# Patient Record
Sex: Female | Born: 1940 | Race: White | Hispanic: No | Marital: Married | Smoking: Never smoker
Health system: Southern US, Community
[De-identification: ages and names within clinical notes are randomized; demographics above are authoritative.]

## PROBLEM LIST (undated history)

## (undated) DIAGNOSIS — M1990 Unspecified osteoarthritis, unspecified site: Secondary | ICD-10-CM

## (undated) DIAGNOSIS — E119 Type 2 diabetes mellitus without complications: Secondary | ICD-10-CM

## (undated) DIAGNOSIS — I2699 Other pulmonary embolism without acute cor pulmonale: Secondary | ICD-10-CM

## (undated) DIAGNOSIS — I10 Essential (primary) hypertension: Secondary | ICD-10-CM

## (undated) DIAGNOSIS — M797 Fibromyalgia: Secondary | ICD-10-CM

## (undated) DIAGNOSIS — E079 Disorder of thyroid, unspecified: Secondary | ICD-10-CM

## (undated) DIAGNOSIS — R112 Nausea with vomiting, unspecified: Secondary | ICD-10-CM

## (undated) DIAGNOSIS — Z9889 Other specified postprocedural states: Secondary | ICD-10-CM

## (undated) DIAGNOSIS — Z972 Presence of dental prosthetic device (complete) (partial): Secondary | ICD-10-CM

## (undated) HISTORY — PX: KNEE SURGERY: SHX244

## (undated) HISTORY — PX: HERNIA REPAIR: SHX51

## (undated) HISTORY — PX: ABDOMINAL SURGERY: SHX537

## (undated) HISTORY — PX: COLON RESECTION: SHX5231

## (undated) HISTORY — PX: CHOLECYSTECTOMY: SHX55

## (undated) HISTORY — PX: ABDOMINAL HYSTERECTOMY: SHX81

## (undated) HISTORY — PX: APPENDECTOMY: SHX54

## (undated) HISTORY — PX: BUNIONECTOMY: SHX129

---

## 2004-11-27 ENCOUNTER — Other Ambulatory Visit: Admission: RE | Admit: 2004-11-27 | Discharge: 2004-11-27 | Payer: Self-pay | Admitting: Obstetrics & Gynecology

## 2005-02-08 ENCOUNTER — Encounter: Admission: RE | Admit: 2005-02-08 | Discharge: 2005-02-08 | Payer: Self-pay | Admitting: Otolaryngology

## 2007-09-09 ENCOUNTER — Encounter: Admission: RE | Admit: 2007-09-09 | Discharge: 2007-09-09 | Payer: Self-pay | Admitting: Family Medicine

## 2012-01-08 ENCOUNTER — Encounter (INDEPENDENT_AMBULATORY_CARE_PROVIDER_SITE_OTHER): Payer: Self-pay | Admitting: Surgery

## 2012-02-17 ENCOUNTER — Ambulatory Visit (INDEPENDENT_AMBULATORY_CARE_PROVIDER_SITE_OTHER): Payer: Medicare Other | Admitting: Surgery

## 2012-02-17 ENCOUNTER — Encounter (INDEPENDENT_AMBULATORY_CARE_PROVIDER_SITE_OTHER): Payer: Self-pay | Admitting: Surgery

## 2012-02-17 VITALS — BP 136/76 | HR 69 | Temp 98.4°F | Resp 18 | Ht 65.0 in | Wt 181.0 lb

## 2012-02-17 DIAGNOSIS — M6208 Separation of muscle (nontraumatic), other site: Secondary | ICD-10-CM | POA: Insufficient documentation

## 2012-02-17 DIAGNOSIS — M6200 Separation of muscle (nontraumatic), unspecified site: Secondary | ICD-10-CM

## 2012-02-17 NOTE — Progress Notes (Signed)
General Surgery [facility]  Chief Complaint  Patient presents with  . New Evaluation    evaluate possible ventral hernia    HISTORY: Patient is a 71 year old white female referred by her plastic surgeon for evaluation of possible ventral incisional hernia. Patient is considering abdominoplasty. She is currently on a weight loss program to lose 20 pounds by his instructions. She has had previous abdominal surgery including exploratory laparotomy, cholecystectomy, and colonic resection for diverticular disease. These procedures were performed in 1990 and in 2004.  Patient is now considering abdominoplasty. Her plastic surgeon request evaluation for possible ventral incisional hernia.  Patient has had no signs or symptoms of hernia. She does note a bulge in the upper abdomen. She occasionally has a burning type sensation at this position. She denies any signs or symptoms of obstruction. She has not had to manually reduce the hernia and any point.  No past medical history on file.   Current Outpatient Prescriptions  Medication Sig Dispense Refill  . amLODipine (NORVASC) 5 MG tablet Take 5 mg by mouth daily.      . cyanocobalamin 1000 MCG tablet Inject 1 mcg into the skin daily. 31m/l per month      . DULoxetine (CYMBALTA) 60 MG capsule Take 60 mg by mouth daily. 120qd      . gabapentin (NEURONTIN) 600 MG tablet Take 600 mg by mouth 3 (three) times daily.      . metFORMIN (GLUCOPHAGE) 1000 MG tablet Take 1,000 mg by mouth 2 (two) times daily with a meal.      . methocarbamol (ROBAXIN) 500 MG tablet Take 500 mg by mouth 4 (four) times daily.      . metoprolol succinate (TOPROL-XL) 50 MG 24 hr tablet Take 50 mg by mouth daily. Take with or immediately following a meal.      . omeprazole (PRILOSEC) 40 MG capsule Take 40 mg by mouth daily.      . pravastatin (PRAVACHOL) 80 MG tablet Take 80 mg by mouth daily.      Marland Kitchen thyroid (ARMOUR) 30 MG tablet Take 30 mg by mouth daily.          Allergies  Allergen Reactions  . Ativan (Lorazepam)   . Codeine   . Sulfur      No family history on file.   History   Social History  . Marital Status: Married    Spouse Name: N/A    Number of Children: N/A  . Years of Education: N/A   Social History Main Topics  . Smoking status: Never Smoker   . Smokeless tobacco: None  . Alcohol Use: None  . Drug Use: None  . Sexually Active: None   Other Topics Concern  . None   Social History Narrative  . None     REVIEW OF SYSTEMS - PERTINENT POSITIVES ONLY: Denies signs or symptoms of intestinal obstruction and denies manual reduction of hernia  EXAM: Filed Vitals:   02/17/12 1316  BP: 136/76  Pulse: 69  Temp: 98.4 F (36.9 C)  Resp: 18    HEENT: normocephalic; pupils equal and reactive; sclerae clear; dentition good; mucous membranes moist NECK:  symmetric on extension; no palpable anterior or posterior cervical lymphadenopathy; no supraclavicular masses; no tenderness CHEST: clear to auscultation bilaterally without rales, rhonchi, or wheezes CARDIAC: regular rate and rhythm without significant murmur; peripheral pulses are full ABDOMEN: soft without distension; bowel sounds present; no mass; no hepatosplenomegaly; well-healed midline abdominal incision; patient is examined  both recumbent and standing with cough and Valsalva and sit up maneuver; no evidence of ventral incisional hernia; mild to moderate diastases particularly in the upper abdomen; no palpable fascial defect identified EXT:  non-tender without edema; no deformity NEURO: no gross focal deficits; no sign of tremor   LABORATORY RESULTS: See [facility] (CHL-Epic) for most recent results   RADIOLOGY RESULTS: See [facility] (CHL-Epic) for most recent results   IMPRESSION: #1 history of laparotomy, 1990 and 2004, with 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 I do not find evidence of a ventral incisional hernia. There is some attenuation of the upper abdominal wall with bulging with increases in intra-abdominal pressure. I do not palpate atrial fascial defect.  Patient will proceed with her weight loss regimen. She will followup with her plastic surgeon to consider abdominoplasty.  I will discuss the case with her surgeon prior to her scheduled date of surgery in case our help is needed at the time of operative intervention. Otherwise I believe she can proceed with abdominoplasty as per the plans of her plastic surgeon without hernia repair at this time.  Patient will return for followup as needed.  Velora Heckler, MD, FACS General & Endocrine Surgery [facility]   Visit Diagnoses: 1. Rectus diastasis     Primary Care Physician: Candelaria Celeste JEHIEL, DO

## 2012-02-17 NOTE — Patient Instructions (Signed)
Hernia A hernia occurs when an internal organ pushes out through a weak spot in the abdominal wall. Hernias most commonly occur in the groin and around the navel. Hernias often can be pushed back into place (reduced). Most hernias tend to get worse over time. Some abdominal hernias can get stuck in the opening (irreducible or incarcerated hernia) and cannot be reduced. An irreducible abdominal hernia which is tightly squeezed into the opening is at risk for impaired blood supply (strangulated hernia). A strangulated hernia is a medical emergency. Because of the risk for an irreducible or strangulated hernia, surgery may be recommended to repair a hernia. CAUSES   Heavy lifting.  Prolonged coughing.  Straining to have a bowel movement.  A cut (incision) made during an abdominal surgery. HOME CARE INSTRUCTIONS   Bed rest is not required. You may continue your normal activities.  Avoid lifting more than 10 pounds (4.5 kg) or straining.  Cough gently. If you are a smoker it is best to stop. Even the best hernia repair can break down with the continual strain of coughing. Even if you do not have your hernia repaired, a cough will continue to aggravate the problem.  Do not wear anything tight over your hernia. Do not try to keep it in with an outside bandage or truss. These can damage abdominal contents if they are trapped within the hernia sac.  Eat a normal diet.  Avoid constipation. Straining over long periods of time will increase hernia size and encourage breakdown of repairs. If you cannot do this with diet alone, stool softeners may be used. SEEK IMMEDIATE MEDICAL CARE IF:   You have a fever.  You develop increasing abdominal pain.  You feel nauseous or vomit.  Your hernia is stuck outside the abdomen, looks discolored, feels hard, or is tender.  You have any changes in your bowel habits or in the hernia that are unusual for you.  You have increased pain or swelling around the  hernia.  You cannot push the hernia back in place by applying gentle pressure while lying down. MAKE SURE YOU:   Understand these instructions.  Will watch your condition.  Will get help right away if you are not doing well or get worse. Document Released: 04/22/2005 Document Revised: 07/15/2011 Document Reviewed: 12/10/2007 Exit[employer] Patient Information 2013 [employer].  

## 2016-03-25 ENCOUNTER — Ambulatory Visit (INDEPENDENT_AMBULATORY_CARE_PROVIDER_SITE_OTHER): Payer: Medicare Other

## 2016-03-25 ENCOUNTER — Ambulatory Visit (INDEPENDENT_AMBULATORY_CARE_PROVIDER_SITE_OTHER): Payer: Medicare Other | Admitting: Podiatry

## 2016-03-25 ENCOUNTER — Encounter: Payer: Self-pay | Admitting: Podiatry

## 2016-03-25 VITALS — Ht 65.0 in | Wt 178.0 lb

## 2016-03-25 DIAGNOSIS — R52 Pain, unspecified: Secondary | ICD-10-CM | POA: Diagnosis not present

## 2016-03-25 DIAGNOSIS — M2010 Hallux valgus (acquired), unspecified foot: Secondary | ICD-10-CM | POA: Diagnosis not present

## 2016-03-25 DIAGNOSIS — M2042 Other hammer toe(s) (acquired), left foot: Secondary | ICD-10-CM

## 2016-03-25 NOTE — Progress Notes (Signed)
   Subjective:    Patient ID: Natasha Chan, female    DOB: 1940/11/25, 75 y.o.   MRN: [id number]  HPI this patient presents the office with chief complaint of a painful big toe  on her left foot.  She admits to having a bunion, which has become extremely painful and sore as she walks and wears her shoes. She says become swollen and painful after activity. She is also concerned about the second toe on the left foot which she believes is a hammertoe. . This patient says she has stopped her metformin for the last 5 months. She presents the office today for an evaluation and treatment    Review of Systems  All other systems reviewed and are negative.      Objective:   Physical Exam GENERAL APPEARANCE: Alert, conversant. Appropriately groomed. No acute distress.  VASCULAR: Pedal pulses are  palpable at  [facility] bilateral.  Capillary refill time is immediate to all digits,  Normal temperature gradient.  Digital hair growth is present bilateral  NEUROLOGIC: sensation is normal to 5.07 monofilament at 5/5 sites bilateral.  Light touch is intact bilateral, Muscle strength normal.  MUSCULOSKELETAL: acceptable muscle strength, tone and stability bilateral.  Intrinsic muscluature intact bilateral.  HAV  B/L.  Hammer toe second left foot.Palpable pain on the inside and dorsal aspect first metatarsal left foot.   DERMATOLOGIC: skin color, texture, and turgor are within normal limits.  No preulcerative lesions or ulcers  are seen, no interdigital maceration noted.  No open lesions present.  Digital nails are asymptomatic. No drainage noted.         Assessment & Plan:  HAV  B/L  Hammer toe second left.  Discussed conservative treatment versus surgical treatment and patient desires to proceed with surgical intervention. She says her daughter had surgery performed by Dr. Al CorpusHyatt. Therefore, this patient was given a surgical consult with Dr. Al CorpusHyatt patient to return to my office in the future as  needed   Helane GuntherGregory Mayer DPM

## 2016-04-09 ENCOUNTER — Ambulatory Visit (INDEPENDENT_AMBULATORY_CARE_PROVIDER_SITE_OTHER): Payer: Medicare Other | Admitting: Podiatry

## 2016-04-09 ENCOUNTER — Encounter: Payer: Self-pay | Admitting: Podiatry

## 2016-04-09 DIAGNOSIS — M2042 Other hammer toe(s) (acquired), left foot: Secondary | ICD-10-CM

## 2016-04-09 DIAGNOSIS — M2010 Hallux valgus (acquired), unspecified foot: Secondary | ICD-10-CM | POA: Diagnosis not present

## 2016-04-09 NOTE — Patient Instructions (Signed)
Pre-Operative Instructions  Congratulations, you have decided to take an important step to improving your quality of life.  You can be assured that the doctors of [facility] will be with you every step of the way.  1. Plan to be at the surgery center/hospital at least 1 (one) hour prior to your scheduled time unless otherwise directed by the surgical center/hospital staff.  You must have a responsible adult accompany you, remain during the surgery and drive you home.  Make sure you have directions to the surgical center/hospital and know how to get there on time. 2. For hospital based surgery you will need to obtain a history and physical form from your family physician within 1 month prior to the date of surgery- we will give you a form for you primary physician.  3. We make every effort to accommodate the date you request for surgery.  There are however, times where surgery dates or times have to be moved.  We will contact you as soon as possible if a change in schedule is required.   4. No Aspirin/Ibuprofen for one week before surgery.  If you are on aspirin, any non-steroidal anti-inflammatory medications (Mobic, Aleve, Ibuprofen) you should stop taking it 7 days prior to your surgery.  You make take Tylenol  For pain prior to surgery.  5. Medications- If you are taking daily heart and blood pressure medications, seizure, reflux, allergy, asthma, anxiety, pain or diabetes medications, make sure the surgery center/hospital is aware before the day of surgery so they may notify you which medications to take or avoid the day of surgery. 6. No food or drink after midnight the night before surgery unless directed otherwise by surgical center/hospital staff. 7. No alcoholic beverages 24 hours prior to surgery.  No smoking 24 hours prior to or 24 hours after surgery. 8. Wear loose pants or shorts- loose enough to fit over bandages, boots, and casts. 9. No slip on shoes, sneakers are best. 10. Bring  your boot with you to the surgery center/hospital.  Also bring crutches or a walker if your physician has prescribed it for you.  If you do not have this equipment, it will be provided for you after surgery. 11. If you have not been contracted by the surgery center/hospital by the day before your surgery, call to confirm the date and time of your surgery. 12. Leave-time from work may vary depending on the type of surgery you have.  Appropriate arrangements should be made prior to surgery with your employer. 13. Prescriptions will be provided immediately following surgery by your doctor.  Have these filled as soon as possible after surgery and take the medication as directed. 14. Remove nail polish on the operative foot. 15. Wash the night before surgery.  The night before surgery wash the foot and leg well with the antibacterial soap provided and water paying special attention to beneath the toenails and in between the toes.  Rinse thoroughly with water and dry well with a towel.  Perform this wash unless told not to do so by your physician.  Enclosed: 1 Ice pack (please put in freezer the night before surgery)   1 Hibiclens skin cleaner   Pre-op Instructions  If you have any questions regarding the instructions, do not hesitate to call our office.  [place]: [address] [place], [place] [address] [phone]  [place]: [address] [place], [place] [address] [phone]  [place]: [address]  [place], [place] [address] [phone]   Dr.   Norman Regal DPM, Dr. Matthew Wagoner DPM, Dr. M. Todd Hyatt DPM, Dr. Titorya Stover DPM 

## 2016-04-10 NOTE — Progress Notes (Signed)
She presents today on referral from Dr. Stacie AcresMayer. She states that she has severe bunion deformities bilaterally and the second toe seems to be bothering her considerably. She's tried conservative therapies all to no avail. She states that these bunions have developed to the point now where they're limiting her ability to perform her daily activities.  Objective: I have reviewed her past medical history medications allergies surgeries and social history. Pulses are strongly palpable. Neurologic sensorium is intact. Deep tendon reflexes are intact. Muscle strength is intact bilateral. Orthopedic evaluation of his drains all joints distal ankle for range of motion without crepitus. Tibia deformity hammertoe deformities are noted (that of the right. Radiographs taken were reviewed demonstrating increase in the first metatarsal angle greater than normal value as well as a hallux abductus angle. She also has hammertoe deformity second. No open lesions or wounds are noted.  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  Plan: Discussed etiology pathology conservative or surgical therapies. She was consented today for surgical correction of her bunion deformity and hammertoe deformity. We discussed the possible postop complications which may include but are not limited to postop pain bleeding swelling infection recurrence need for further surgery. She understands this is amenable to it and all 3 pages of the consent form and I will follow-up with her in the near future for surgery. We dispensed a Cam Walker today for postop recovery period.

## 2016-04-25 ENCOUNTER — Other Ambulatory Visit: Payer: Self-pay | Admitting: Podiatry

## 2016-04-25 MED ORDER — CEPHALEXIN 500 MG PO CAPS: 500.0000 mg | ORAL_CAPSULE | Freq: Three times a day (TID) | ORAL | 0 refills | Status: DC

## 2016-04-25 MED ORDER — HYDROMORPHONE HCL 4 MG PO TABS: 4.0000 mg | ORAL_TABLET | ORAL | 0 refills | Status: DC | PRN

## 2016-04-25 MED ORDER — ONDANSETRON HCL 4 MG PO TABS: 4.0000 mg | ORAL_TABLET | Freq: Three times a day (TID) | ORAL | 0 refills | Status: DC | PRN

## 2016-04-26 ENCOUNTER — Encounter: Payer: Self-pay | Admitting: Podiatry

## 2016-04-26 DIAGNOSIS — M21542 Acquired clubfoot, left foot: Secondary | ICD-10-CM | POA: Diagnosis not present

## 2016-04-26 DIAGNOSIS — M7752 Other enthesopathy of left foot: Secondary | ICD-10-CM | POA: Diagnosis not present

## 2016-04-26 DIAGNOSIS — M2012 Hallux valgus (acquired), left foot: Secondary | ICD-10-CM | POA: Diagnosis not present

## 2016-04-28 ENCOUNTER — Encounter (HOSPITAL_COMMUNITY): Payer: Self-pay

## 2016-04-28 ENCOUNTER — Emergency Department (HOSPITAL_COMMUNITY): Payer: Medicare Other

## 2016-04-28 ENCOUNTER — Emergency Department (HOSPITAL_COMMUNITY)
Admission: EM | Admit: 2016-04-28 | Discharge: 2016-04-28 | Disposition: A | Discharge status: Home / Self Care | Payer: Medicare Other | Attending: Emergency Medicine | Admitting: Emergency Medicine

## 2016-04-28 DIAGNOSIS — Z79899 Other long term (current) drug therapy: Secondary | ICD-10-CM | POA: Diagnosis not present

## 2016-04-28 DIAGNOSIS — R1084 Generalized abdominal pain: Secondary | ICD-10-CM | POA: Diagnosis not present

## 2016-04-28 DIAGNOSIS — E119 Type 2 diabetes mellitus without complications: Secondary | ICD-10-CM | POA: Insufficient documentation

## 2016-04-28 DIAGNOSIS — Z7984 Long term (current) use of oral hypoglycemic drugs: Secondary | ICD-10-CM | POA: Insufficient documentation

## 2016-04-28 DIAGNOSIS — R1013 Epigastric pain: Secondary | ICD-10-CM | POA: Diagnosis present

## 2016-04-28 DIAGNOSIS — I10 Essential (primary) hypertension: Secondary | ICD-10-CM | POA: Insufficient documentation

## 2016-04-28 DIAGNOSIS — R140 Abdominal distension (gaseous): Secondary | ICD-10-CM

## 2016-04-28 DIAGNOSIS — R143 Flatulence: Secondary | ICD-10-CM

## 2016-04-28 HISTORY — DX: Other pulmonary embolism without acute cor pulmonale: I26.99

## 2016-04-28 HISTORY — DX: Type 2 diabetes mellitus without complications: E11.9

## 2016-04-28 HISTORY — DX: Essential (primary) hypertension: I10

## 2016-04-28 HISTORY — DX: Disorder of thyroid, unspecified: E07.9

## 2016-04-28 LAB — I-STAT CG4 LACTIC ACID, ED: Lactic Acid, Venous: 1.66 mmol/L (ref 0.5–1.9)

## 2016-04-28 LAB — CBC WITH DIFFERENTIAL/PLATELET
Basophils Absolute: 0 10*3/uL (ref 0.0–0.1)
Basophils Relative: 0 %
Eosinophils Absolute: 0.1 10*3/uL (ref 0.0–0.7)
Eosinophils Relative: 1 %
HCT: 36.7 % (ref 36.0–46.0)
Hemoglobin: 12.4 g/dL (ref 12.0–15.0)
Lymphocytes Relative: 9 %
Lymphs Abs: 1.3 10*3/uL (ref 0.7–4.0)
MCH: 29 pg (ref 26.0–34.0)
MCHC: 33.8 g/dL (ref 30.0–36.0)
MCV: 85.9 fL (ref 78.0–100.0)
Monocytes Absolute: 1.5 10*3/uL — ABNORMAL HIGH (ref 0.1–1.0)
Monocytes Relative: 11 %
Neutro Abs: 10.4 10*3/uL — ABNORMAL HIGH (ref 1.7–7.7)
Neutrophils Relative %: 79 %
Platelets: 217 10*3/uL (ref 150–400)
RBC: 4.27 MIL/uL (ref 3.87–5.11)
RDW: 13.3 % (ref 11.5–15.5)
WBC: 13.3 10*3/uL — ABNORMAL HIGH (ref 4.0–10.5)

## 2016-04-28 LAB — COMPREHENSIVE METABOLIC PANEL
ALT: 15 U/L (ref 14–54)
AST: 21 U/L (ref 15–41)
Albumin: 3.3 g/dL — ABNORMAL LOW (ref 3.5–5.0)
Alkaline Phosphatase: 47 U/L (ref 38–126)
Anion gap: 9 (ref 5–15)
BUN: 13 mg/dL (ref 6–20)
CO2: 27 mmol/L (ref 22–32)
Calcium: 9.3 mg/dL (ref 8.9–10.3)
Chloride: 95 mmol/L — ABNORMAL LOW (ref 101–111)
Creatinine, Ser: 1.45 mg/dL — ABNORMAL HIGH (ref 0.44–1.00)
GFR calc Af Amer: 40 mL/min — ABNORMAL LOW (ref >60)
GFR calc non Af Amer: 35 mL/min — ABNORMAL LOW (ref >60)
Glucose, Bld: 143 mg/dL — ABNORMAL HIGH (ref 65–99)
Potassium: 3 mmol/L — ABNORMAL LOW (ref 3.5–5.1)
Sodium: 131 mmol/L — ABNORMAL LOW (ref 135–145)
Total Bilirubin: 0.8 mg/dL (ref 0.3–1.2)
Total Protein: 6.4 g/dL — ABNORMAL LOW (ref 6.5–8.1)

## 2016-04-28 LAB — URINALYSIS, ROUTINE W REFLEX MICROSCOPIC
BILIRUBIN URINE: NEGATIVE
Glucose, UA: NEGATIVE mg/dL
HGB URINE DIPSTICK: NEGATIVE
KETONES UR: NEGATIVE mg/dL
Leukocytes, UA: NEGATIVE
NITRITE: NEGATIVE
PH: 5 (ref 5.0–8.0)
Protein, ur: NEGATIVE mg/dL
SPECIFIC GRAVITY, URINE: 1.006 (ref 1.005–1.030)

## 2016-04-28 LAB — LIPASE, BLOOD: Lipase: 12 U/L (ref 11–51)

## 2016-04-28 LAB — TROPONIN I: Troponin I: <0.03 ng/mL (ref <0.03)

## 2016-04-28 MED ORDER — SODIUM CHLORIDE 0.9 % IV BOLUS (SEPSIS)
1000.0000 mL | Freq: Once | INTRAVENOUS | Status: AC
  Administered 2016-04-28: 1000 mL via INTRAVENOUS

## 2016-04-28 MED ORDER — ALUMINUM-MAGNESIUM-SIMETHICONE 200-200-20 MG/5ML PO SUSP: 30.0000 mL | Freq: Three times a day (TID) | ORAL | 0 refills | Status: DC

## 2016-04-28 MED ORDER — FAMOTIDINE IN NACL 20-0.9 MG/50ML-% IV SOLN
20.0000 mg | Freq: Once | INTRAVENOUS | Status: AC
  Administered 2016-04-28: 20 mg via INTRAVENOUS
  Filled 2016-04-28: qty 50

## 2016-04-28 MED ORDER — DICYCLOMINE HCL 20 MG PO TABS: 20.0000 mg | ORAL_TABLET | Freq: Two times a day (BID) | ORAL | 0 refills | Status: DC

## 2016-04-28 MED ORDER — SODIUM CHLORIDE 0.9 % IV BOLUS (SEPSIS): 1000.0000 mL | Freq: Once | INTRAVENOUS | Status: DC

## 2016-04-28 MED ORDER — DIPHENHYDRAMINE HCL 50 MG/ML IJ SOLN
25.0000 mg | Freq: Once | INTRAMUSCULAR | Status: AC
  Administered 2016-04-28: 25 mg via INTRAVENOUS
  Filled 2016-04-28: qty 1

## 2016-04-28 MED ORDER — SODIUM CHLORIDE 0.9 % IV BOLUS (SEPSIS)
500.0000 mL | Freq: Once | INTRAVENOUS | Status: AC
  Administered 2016-04-28: 500 mL via INTRAVENOUS

## 2016-04-28 MED ORDER — PIPERACILLIN-TAZOBACTAM 3.375 G IVPB 30 MIN: 3.3750 g | Freq: Once | INTRAVENOUS | Status: DC

## 2016-04-28 MED ORDER — ALUM & MAG HYDROXIDE-SIMETH 200-200-20 MG/5ML PO SUSP
30.0000 mL | Freq: Once | ORAL | Status: AC
  Administered 2016-04-28: 30 mL via ORAL
  Filled 2016-04-28: qty 30

## 2016-04-28 MED ORDER — VANCOMYCIN HCL 10 G IV SOLR
1500.0000 mg | Freq: Once | INTRAVENOUS | Status: AC
  Administered 2016-04-28: 1500 mg via INTRAVENOUS
  Filled 2016-04-28: qty 1500

## 2016-04-28 MED ORDER — ONDANSETRON HCL 4 MG/2ML IJ SOLN
4.0000 mg | Freq: Once | INTRAMUSCULAR | Status: AC
  Administered 2016-04-28: 4 mg via INTRAVENOUS
  Filled 2016-04-28: qty 2

## 2016-04-28 MED ORDER — VANCOMYCIN HCL IN DEXTROSE 1-5 GM/200ML-% IV SOLN: 1000.0000 mg | Freq: Once | INTRAVENOUS | Status: DC

## 2016-04-28 MED ORDER — IOPAMIDOL (ISOVUE-300) INJECTION 61%
INTRAVENOUS | Status: AC
  Administered 2016-04-28: 75 mL
  Filled 2016-04-28: qty 75

## 2016-04-28 MED ORDER — FENTANYL CITRATE (PF) 100 MCG/2ML IJ SOLN
50.0000 ug | Freq: Once | INTRAMUSCULAR | Status: AC
  Administered 2016-04-28: 50 ug via INTRAVENOUS
  Filled 2016-04-28: qty 2

## 2016-04-28 MED ORDER — PIPERACILLIN-TAZOBACTAM 3.375 G IVPB 30 MIN
3.3750 g | Freq: Once | INTRAVENOUS | Status: AC
  Administered 2016-04-28: 3.375 g via INTRAVENOUS
  Filled 2016-04-28: qty 50

## 2016-04-28 MED ORDER — HYDROMORPHONE HCL 2 MG/ML IJ SOLN
1.0000 mg | Freq: Once | INTRAMUSCULAR | Status: AC
  Administered 2016-04-28: 1 mg via INTRAVENOUS
  Filled 2016-04-28: qty 1

## 2016-04-28 NOTE — ED Triage Notes (Addendum)
Pt c/o abdominal pain 2 days. Hx of abdominal surgery  and esophageal dialation x 3 . Belching frequently. No vomiting Left bunion surgery on Friday.

## 2016-04-28 NOTE — ED Provider Notes (Signed)
[facility]-EMER[facility]ENCY DEPT Provider Note   CSN: [id number] Arrival date & time: 04/28/16  0847     History   Chief Complaint Chief Complaint  Patient presents with  . Abdominal Pain    HPI Natasha Chan is a 75 y.o. female female who presents emergency per with chief complaint of abdominal pain. She is a past medical history of hiatal hernia repair, exploratory laparotomy with previous: Cholecystectomy, appendectomy, and colon resection for perforated diverticulitis. The patient recently had a podiatric surgery about 3 days ago. Patient has had a previous dilations of her esophagus at [facility] [place]. Patient has had 3 days of worsening severe epigastric pain with persistent nausea without vomiting and significant belching. She has constipation, diarrhea, fevers or chills. She has been unable to hold down her medications for pain or nausea. She has a history of previous small bowel obstruction. She denies chest pain, shortness of breath.  HPI  Past Medical History:  Diagnosis Date  . Diabetes mellitus without complication (HCC)   . Hypertension   . Pulmonary embolism (HCC)   . Thyroid disease     Patient Active Problem List   Diagnosis Date Noted  . Rectus diastasis 02/17/2012    Past Surgical History:  Procedure Laterality Date  . ABDOMINAL HYSTERECTOMY    . ABDOMINAL SURGERY    . APPENDECTOMY    . BUNIONECTOMY    . CHOLECYSTECTOMY    . COLON RESECTION    . HERNIA REPAIR    . KNEE SURGERY      OB History    Gravida Para Term Preterm AB Living   4 4           SAB TAB Ectopic Multiple Live Births                   Home Medications    Prior to Admission medications   Medication Sig Start Date End Date Taking? Authorizing Provider  cephALEXin (KEFLEX) 500 MG capsule Take 1 capsule (500 mg total) by mouth 3 (three) times daily. Patient taking differently: Take 500 mg by mouth 3 (three) times daily. Still has 8 days left 04/25/16  Yes Max T Hyatt, DPM    cyanocobalamin (,VITAMIN B-12,) 1000 MCG/ML injection Inject 1 mL into the muscle every 30 (thirty) days. 03/24/12  Yes Historical Provider, MD  dicyclomine (BENTYL) 10 MG capsule Take 10 mg by mouth 4 (four) times daily -  before meals and at bedtime.   Yes Historical Provider, MD  DULoxetine (CYMBALTA) 60 MG capsule Take 120 mg by mouth daily.    Yes Historical Provider, MD  furosemide (LASIX) 20 MG tablet Take 20 mg by mouth daily as needed for fluid. 02/23/16  Yes Historical Provider, MD  HYDROmorphone (DILAUDID) 4 MG tablet Take 1 tablet (4 mg total) by mouth every 4 (four) hours as needed for severe pain. 04/25/16  Yes Max T Hyatt, DPM  Olmesartan-Amlodipine-HCTZ (TRIBENZOR) 20-5-12.5 MG TABS Take 1 tablet by mouth daily. 10/16/15  Yes Historical Provider, MD  ondansetron (ZOFRAN) 4 MG tablet Take 1 tablet (4 mg total) by mouth every 8 (eight) hours as needed for nausea or vomiting. 04/25/16  Yes Max T Hyatt, DPM  sitaGLIPtin (JANUVIA) 50 MG tablet Take 50 mg by mouth daily. 10/12/15  Yes Historical Provider, MD  thyroid (ARMOUR) 30 MG tablet Take 30 mg by mouth daily.   Yes Historical Provider, MD    Family History No family history on file.  Social History Social History  Substance Use Topics  . Smoking status: Never Smoker  . Smokeless tobacco: Never Used  . Alcohol use Yes     Comment: occasional     Allergies   Ativan [lorazepam]; Codeine; and Sulfur   Review of Systems Review of Systems  Ten systems reviewed and are negative for acute change, except as noted in the HPI.   Physical Exam Updated Vital Signs BP 117/70   Pulse 81   Temp 97.5 F (36.4 C) (Oral)   Resp 14   Ht 5\' 5"  (1.651 m)   Wt 79.4 kg   SpO2 94%   BMI 29.12 kg/m   Physical Exam  Constitutional: She is oriented to person, place, and time. She appears well-developed and well-nourished.  Appears extremely uncomfortable,  HENT:  Head: Normocephalic and atraumatic.  Eyes: EOM are normal. Pupils  are equal, round, and reactive to light.  Neck: Normal range of motion. Neck supple.  Cardiovascular: Normal rate, regular rhythm and normal heart sounds.   Pulmonary/Chest: Effort normal and breath sounds normal. No respiratory distress.  Abdominal: Soft. She exhibits distension. There is tenderness.  Distended, moderately tender. Tympanic abdomen.  Musculoskeletal: Normal range of motion.  Neurological: She is alert and oriented to person, place, and time.  Skin: Skin is warm and dry. No rash noted. She is not diaphoretic.  Nursing note and vitals reviewed.    ED Treatments / Results  Labs (all labs ordered are listed, but only abnormal results are displayed) Labs Reviewed  CBC WITH DIFFERENTIAL/PLATELET - Abnormal; Notable for the following:       Result Value   WBC 13.3 (*)    Neutro Abs 10.4 (*)    Monocytes Absolute 1.5 (*)    All other components within normal limits  COMPREHENSIVE METABOLIC PANEL - Abnormal; Notable for the following:    Sodium 131 (*)    Potassium 3.0 (*)    Chloride 95 (*)    Glucose, Bld 143 (*)    Creatinine, Ser 1.45 (*)    Total Protein 6.4 (*)    Albumin 3.3 (*)    GFR calc non Af Amer 35 (*)    GFR calc Af Amer 40 (*)    All other components within normal limits  URINALYSIS, ROUTINE W REFLEX MICROSCOPIC - Abnormal; Notable for the following:    Color, Urine STRAW (*)    All other components within normal limits  LIPASE, BLOOD  TROPONIN I  I-STAT CG4 LACTIC ACID, ED  I-STAT CG4 LACTIC ACID, ED    EKG  EKG Interpretation  Date/Time:  Sunday April 28 2016 11:28:36 EST Ventricular Rate:  84 PR Interval:    QRS Duration: 98 QT Interval:  368 QTC Calculation: 435 R Axis:   48 Text Interpretation:  Sinus rhythm Low voltage, precordial leads No prior ECG for comparison.  No STEMI Confirmed by Rush LandmarkEGELER MD, CHRISTOPHER [phone]([id number]) on 04/28/2016 11:35:03 AM Also confirmed by Rush LandmarkEGELER MD, CHRISTOPHER [phone]([id number]), editor Wandalee FerdinandLOGAN, KIMBERLY [phone]([id number])  on  04/28/2016 12:15:22 PM       Radiology Ct Abdomen Pelvis W Contrast  Result Date: 04/28/2016 CLINICAL DATA:  Epigastric pain, abdominal distention and pressure, history of prior colon resection and adhesions, diabetes mellitus, hypertension EXAM: CT ABDOMEN AND PELVIS WITH CONTRAST TECHNIQUE: Multidetector CT imaging of the abdomen and pelvis was performed using the standard protocol following bolus administration of intravenous contrast. Sagittal and coronal MPR images reconstructed from axial data set. CONTRAST:  75mL ISOVUE-300 IOPAMIDOL (ISOVUE-300) INJECTION 61% COMPARISON:  04/21/2013  FINDINGS: Lower chest: Minimal dependent atelectasis at lung bases. Hepatobiliary: Low-attenuation lesion laterally RIGHT lobe liver 23 x 20 mm image 22, grossly stable. Smaller areas of low-attenuation in the lateral segment LEFT lobe appear unchanged. Previously seen small lesion low-attenuation lesion in the RIGHT lobe is no longer identified. Gallbladder surgically absent. Pancreas: Normal appearance Spleen: Normal appearance Adrenals/Urinary Tract: Adrenal glands normal appearance. Kidneys, ureters, and bladder normal appearance. Stomach/Bowel: Appendix surgically absent by history. Prior hiatal hernia repair. Sigmoid diverticulosis without pericolic inflammatory changes to suggest acute diverticulitis. Stomach and bowel loops otherwise normal appearance. Vascular/Lymphatic: Atherosclerotic calcifications aorta without aneurysm. Minimal pericardial effusion. No adenopathy. Minimal stranding of fat adjacent to the celiac artery, without duodenal of pancreatic abnormality, nonspecific, without definite celiac artery thickening to suggest vasculitis. Reproductive: Uterus surgically absent with nonvisualization of ovaries. Other: No free air or free fluid.  No hernia. Musculoskeletal: Bones appear demineralized. IMPRESSION: Liver lesions, largest 23 x 20 mm, grossly unchanged. Prior cholecystectomy, hiatal hernia  repair, appendectomy, and hysterectomy. Aortic atherosclerosis. Mild sigmoid diverticulosis. Hazy appearance of fat adjacent to the celiac artery, nonspecific, without definite celiac artery thickening to suggest vasculitis. No definite acute intra-abdominal or intrapelvic abnormalities otherwise identified. Electronically Signed   By: Ulyses Southward M.D.   On: 04/28/2016 12:27   Dg Chest Portable 1 View  Result Date: 04/28/2016 CLINICAL DATA:  Abdominal pain, nausea EXAM: PORTABLE CHEST 1 VIEW COMPARISON:  11/24/2013 FINDINGS: Lungs are clear.  No pleural effusion or pneumothorax. The heart is normal in size. IMPRESSION: No evidence of acute cardiopulmonary disease. Electronically Signed   By: Charline Bills M.D.   On: 04/28/2016 09:40   Dg Abd Portable 1 View  Result Date: 04/28/2016 CLINICAL DATA:  Abdominal pain, nausea EXAM: PORTABLE ABDOMEN - 1 VIEW COMPARISON:  CT abdomen/pelvis dated 04/21/2013 FINDINGS: Nonobstructive bowel gas pattern. Cholecystectomy clips. Additional surgical clips in the left mid abdomen and pelvis. Surgical sutures in the pelvis. Mild degenerative changes of the lumbar spine. IMPRESSION: Unremarkable abdominal radiograph. Electronically Signed   By: Charline Bills M.D.   On: 04/28/2016 09:40    Procedures Procedures (including critical care time)  Medications Ordered in ED Medications  sodium chloride 0.9 % bolus 1,000 mL (0 mLs Intravenous Stopped 04/28/16 1300)    And  sodium chloride 0.9 % bolus 1,000 mL (1,000 mLs Intravenous New Bag/Given 04/28/16 1119)    And  sodium chloride 0.9 % bolus 500 mL (0 mLs Intravenous Stopped 04/28/16 1318)  ondansetron (ZOFRAN) injection 4 mg (4 mg Intravenous Given 04/28/16 0951)  fentaNYL (SUBLIMAZE) injection 50 mcg (50 mcg Intravenous Given 04/28/16 0954)  piperacillin-tazobactam (ZOSYN) IVPB 3.375 g (0 g Intravenous Stopped 04/28/16 1300)  vancomycin (VANCOCIN) 1,500 mg in sodium chloride 0.9 % 500 mL IVPB (0 mg  Intravenous Stopped 04/28/16 1319)  HYDROmorphone (DILAUDID) injection 1 mg (1 mg Intravenous Given 04/28/16 1121)  iopamidol (ISOVUE-300) 61 % injection (75 mLs  Contrast Given 04/28/16 1153)  diphenhydrAMINE (BENADRYL) injection 25 mg (25 mg Intravenous Given 04/28/16 1313)  famotidine (PEPCID) IVPB 20 mg premix (20 mg Intravenous New Bag/Given 04/28/16 1323)  alum & mag hydroxide-simeth (MAALOX/MYLANTA) 200-200-20 MG/5ML suspension 30 mL (30 mLs Oral Given 04/28/16 1324)     Initial Impression / Assessment and Plan / ED Course  I have reviewed the triage vital signs and the nursing notes.  Pertinent labs & imaging results that were available during my care of the patient were reviewed by me and considered in my medical decision making (see chart  for details).  Clinical Course as of Apr 28 1424  Sun Apr 28, 2016  0957 PLAIN FILMS WITHOUT ACUTE abnormality  [AH]  1310 CT ABDOMEN PELVIS W CONTRAST [AH]    Clinical Course User Index [AH] Arthor Captain, PA-C    Patient noted to be hypotensive. There is some confusion as initially I thought the patient had just recently had a dilation procedure none podiatric procedure. Concern for potential perforation versus sepsis with her hypotension and initial protocol was ordered with pharmacy consult. However, I discontinued the order for vancomycin and Zosyn. Pharmacies so gave these medications. The patient. Acute abdominal and chest films did not show any significant abnormality.  Lab values show leukocytosis, urine, and troponin, and lactate are all without significant abnormality. She does have some mild hypokalemia likely secondary to poor oral intake. Her pain is improved after medications, she was given Maalox and simethicone. Her CT scan is without significant abnormality. Patient appears to have retained gas in the abdomen causing her significant pain. She is greatly improved with her interventions and blood pressure improved as well. Patient  is feeling better and would like to be discharged at this time. She should visit with Dr. Rush Landmark who agrees with plan for discharge. Patient's follow-up within the next 2 days with her PCP  Final Clinical Impressions(s) / ED Diagnoses   Final diagnoses:  Generalized abdominal pain  Unable to pass flatus    New Prescriptions New Prescriptions   No medications on file     Arthor Captain, PA-C 04/28/16 1738    Canary Brim Tegeler, MD 05/02/16 1020

## 2016-04-28 NOTE — ED Notes (Signed)
Patient able to ambulate independently  

## 2016-04-28 NOTE — Progress Notes (Signed)
Pharmacy Antibiotic Note  Natasha Chan is a 75 y.o. female with history of abdominal surgery in 2015 and multiple esophageal dilatations (last 12/04/2015) now in ED  on 04/28/2016 with abdominal pain and possible sepsis.  Pharmacy has been consulted for Vancomycin and Zosyn dosing. Lactic acid 1.66, Afebrile. BP soft (MAP currently 82). RR 23, HR 70s.   No Scr available today. Per Care Everywhere- SCr was 1.19 on 12/04/15.   Plan: Zosyn 3.375g IV x1 over 30 minutes. Vancomycin 1500mg  IV x1 now. Follow-up labs for further dosing.   Height: 5\' 5"  (165.1 cm) Weight: 175 lb (79.4 kg) IBW/kg (Calculated) : 57  Temp (24hrs), Avg:97.5 F (36.4 C), Min:97.5 F (36.4 C), Max:97.5 F (36.4 C)  No results for input(s): WBC, CREATININE, LATICACIDVEN, VANCOTROUGH, VANCOPEAK, VANCORANDOM, GENTTROUGH, GENTPEAK, GENTRANDOM, TOBRATROUGH, TOBRAPEAK, TOBRARND, AMIKACINPEAK, AMIKACINTROU, AMIKACIN in the last 168 hours.  CrCl cannot be calculated (No order found.).    Allergies  Allergen Reactions  . Ativan [Lorazepam]   . Codeine   . Sulfur     Antimicrobials this admission: Vancomycin 12/24 >> Zosyn 12/24 >>  Dose adjustments this admission:   Microbiology results: 12/24 BCx:  12/24 UCx:   Thank you for allowing pharmacy to be a part of this patient's care.  Link SnufferJessica Katurah Karapetian, PharmD, BCPS Clinical Pharmacist [phone] until 3:30 PM today [phone] after hours 04/28/2016 9:22 AM

## 2016-04-28 NOTE — Discharge Instructions (Signed)

## 2016-05-02 ENCOUNTER — Encounter: Payer: Medicare Other | Admitting: Podiatry

## 2016-05-03 ENCOUNTER — Ambulatory Visit (INDEPENDENT_AMBULATORY_CARE_PROVIDER_SITE_OTHER): Payer: Medicare Other | Admitting: Podiatry

## 2016-05-03 ENCOUNTER — Ambulatory Visit (INDEPENDENT_AMBULATORY_CARE_PROVIDER_SITE_OTHER): Payer: Medicare Other

## 2016-05-03 VITALS — Temp 97.1°F

## 2016-05-03 DIAGNOSIS — M2042 Other hammer toe(s) (acquired), left foot: Secondary | ICD-10-CM

## 2016-05-03 DIAGNOSIS — M2010 Hallux valgus (acquired), unspecified foot: Secondary | ICD-10-CM | POA: Diagnosis not present

## 2016-05-03 NOTE — Progress Notes (Signed)
Subjective:     Patient ID: Natasha BandySylvia A Gish, female   DOB: [birth year], 75 y.o.   MRN: [id number]  HPI patient states she's doing well with her left foot with minimal discomfort or swelling and she is able to walk on it with boot   Review of Systems     Objective:   Physical Exam Neurovascular status intact negative Homan sign was noted with well-healed surgical site left first second metatarsal with left toe in good alignment. The stitches are intact and wound edges are well coapted    Assessment:     Doing well post surgical intervention by Dr. Al CorpusHyatt left foot    Plan:     Reapplied sterile dressing instructed on continued elevation immobilization and reviewed x-rays and reappoint one week for stitch removal  X-ray report indicated that there is good healing of the osteotomies with screws in place and good alignment noted

## 2016-05-07 NOTE — Progress Notes (Signed)
DOS 12.22.2017 [place] Bunion Repair with Screws Left Foot; 2nd Metatarsal Osteotomy with Screws and Pinning of 2nd Toe Left

## 2016-05-08 ENCOUNTER — Telehealth: Payer: Self-pay | Admitting: *Deleted

## 2016-05-08 MED ORDER — HYDROMORPHONE HCL 4 MG PO TABS: 4.0000 mg | ORAL_TABLET | ORAL | 0 refills | Status: DC | PRN

## 2016-05-08 NOTE — Telephone Encounter (Addendum)
Pt requested refill of pain medication. I told pt Dr. Al CorpusHyatt would refill, and she would have to pick up in the [place] office. Pt asked if she could take Ibuprofen until Friday when she had an appt and not make an extra trip out. I told her it would be printed up if she changed her mind and she could take the Ibuprofen if she tolerated it. 05/28/2016-Pt left name, DOB and phone number, these were not clear for understanding, and no message. 05/29/2016-I spoke with pt and she said she had surgery 04/26/2016 and wanted to know if she could soak her foot in epsom salt. I told pt I would ask Dr. Al CorpusHyatt and she stated she has an appt with Dr. Al CorpusHyatt tomorrow and will ask then. I apologized for the late call and she thanked me for calling.

## 2016-05-08 NOTE — Telephone Encounter (Signed)
Pt requested refill of pain medication

## 2016-05-10 ENCOUNTER — Ambulatory Visit (INDEPENDENT_AMBULATORY_CARE_PROVIDER_SITE_OTHER): Payer: Medicare Other | Admitting: Podiatry

## 2016-05-10 DIAGNOSIS — M2010 Hallux valgus (acquired), unspecified foot: Secondary | ICD-10-CM | POA: Diagnosis not present

## 2016-05-10 MED ORDER — ONDANSETRON HCL 4 MG PO TABS: 4.0000 mg | ORAL_TABLET | Freq: Three times a day (TID) | ORAL | 0 refills | Status: AC | PRN

## 2016-05-10 NOTE — Progress Notes (Signed)
Subjective:     Patient ID: Natasha Chan, female   DOB: [birth year], 76 y.o.   MRN: [id number]  HPI patient presents stating she's doing well with minimal discomfort   Review of Systems     Objective:   Physical Exam Neurovascular status intact with wound edges well coapted good alignment and no pathology with no drainage    Assessment:     Doing well postop    Plan:     Stitches removed wound edges well coapted dressing applied and continue with elevation immobilization

## 2016-05-23 ENCOUNTER — Ambulatory Visit: Payer: Medicare Other | Admitting: Podiatry

## 2016-05-30 ENCOUNTER — Ambulatory Visit (INDEPENDENT_AMBULATORY_CARE_PROVIDER_SITE_OTHER): Payer: Medicare Other

## 2016-05-30 ENCOUNTER — Ambulatory Visit (INDEPENDENT_AMBULATORY_CARE_PROVIDER_SITE_OTHER): Payer: Self-pay | Admitting: Podiatry

## 2016-05-30 ENCOUNTER — Encounter: Payer: Self-pay | Admitting: Podiatry

## 2016-05-30 DIAGNOSIS — M2010 Hallux valgus (acquired), unspecified foot: Secondary | ICD-10-CM | POA: Diagnosis not present

## 2016-05-30 DIAGNOSIS — Z9889 Other specified postprocedural states: Secondary | ICD-10-CM

## 2016-05-30 DIAGNOSIS — M2042 Other hammer toe(s) (acquired), left foot: Secondary | ICD-10-CM | POA: Diagnosis not present

## 2016-05-30 NOTE — Progress Notes (Signed)
She presents today for postop visit date of surgery 04/26/2016 [facility] fixation second metatarsal osteotomy left with screw and hammertoe repair second left. She states it is doing pretty well at this point.  Objective: Vital signs are stable alert and oriented 3 no erythema edema cellulitis drainage or odor. She is in very good alignment has minimal edema no erythema. Radiographs taken today demonstrate a osteotomy in good position with internal fixation in good position. Toe is in good alignment.  Assessment: Well-healing surgical foot.  Plan: I encouraged range of motion exercises and recommended that she get back into regular per tennis shoes and I will follow-up with her in 3-4 weeks.

## 2016-07-02 ENCOUNTER — Ambulatory Visit: Payer: Medicare Other | Admitting: Podiatry

## 2016-07-08 ENCOUNTER — Ambulatory Visit: Payer: Medicare Other | Admitting: Podiatry

## 2016-07-10 ENCOUNTER — Ambulatory Visit (INDEPENDENT_AMBULATORY_CARE_PROVIDER_SITE_OTHER): Payer: Medicare Other | Admitting: Podiatry

## 2016-07-10 ENCOUNTER — Ambulatory Visit (INDEPENDENT_AMBULATORY_CARE_PROVIDER_SITE_OTHER): Payer: Medicare Other

## 2016-07-10 ENCOUNTER — Encounter: Payer: Self-pay | Admitting: Podiatry

## 2016-07-10 DIAGNOSIS — M2012 Hallux valgus (acquired), left foot: Secondary | ICD-10-CM

## 2016-07-13 NOTE — Progress Notes (Signed)
Subjective:     Patient ID: Natasha Chan, female   DOB: [birth year], 76 y.o.   MRN: [id number]  HPI patient presents stating that she's doing well but she's got some elevation of the second and third digits that she wanted to get checked   Review of Systems     Objective:   Physical Exam Neurovascular status intact with patient's second third digits healed well but some slight elevation second toe with a structural bunion deformity doing well with minimal discomfort or swelling    Assessment:     Overall doing well with mild alignment issues second and third digit    Plan:     Advised this patient on the importance of stretching activities and lowering the second and third toes with bandaging techniques and bracing. Patient overall should have an uneventful recovery but we'll need to be watched and x-rays were reviewed today  X-ray report indicates that the osteotomy is healing well with good alignment and digits are in good position

## 2016-07-29 ENCOUNTER — Other Ambulatory Visit: Payer: Self-pay | Admitting: Obstetrics & Gynecology

## 2016-07-29 DIAGNOSIS — R928 Other abnormal and inconclusive findings on diagnostic imaging of breast: Secondary | ICD-10-CM

## 2016-07-31 ENCOUNTER — Ambulatory Visit
Admission: RE | Admit: 2016-07-31 | Discharge: 2016-07-31 | Disposition: A | Discharge status: Home / Self Care | Payer: Medicare Other | Source: Ambulatory Visit | Attending: Obstetrics & Gynecology | Admitting: Obstetrics & Gynecology

## 2016-07-31 DIAGNOSIS — R928 Other abnormal and inconclusive findings on diagnostic imaging of breast: Secondary | ICD-10-CM

## 2017-01-20 ENCOUNTER — Ambulatory Visit: Payer: Medicare Other | Admitting: Podiatry

## 2017-01-24 ENCOUNTER — Ambulatory Visit (INDEPENDENT_AMBULATORY_CARE_PROVIDER_SITE_OTHER): Payer: Medicare Other | Admitting: Podiatry

## 2017-01-24 ENCOUNTER — Ambulatory Visit (INDEPENDENT_AMBULATORY_CARE_PROVIDER_SITE_OTHER): Payer: Medicare Other

## 2017-01-24 ENCOUNTER — Encounter: Payer: Self-pay | Admitting: Podiatry

## 2017-01-24 DIAGNOSIS — M2042 Other hammer toe(s) (acquired), left foot: Secondary | ICD-10-CM

## 2017-01-24 NOTE — Patient Instructions (Signed)
Pre-Operative Instructions  Congratulations, you have decided to take an important step towards improving your quality of life.  You can be assured that the doctors and staff at [facility] will be with you every step of the way.  Here are some important things you should know:  1. Plan to be at the surgery center/hospital at least 1 (one) hour prior to your scheduled time, unless otherwise directed by the surgical center/hospital staff.  You must have a responsible adult accompany you, remain during the surgery and drive you home.  Make sure you have directions to the surgical center/hospital to ensure you arrive on time. 2. If you are having surgery at [facility] or [facility] hospitals, you will need a copy of your medical history and physical form from your family physician within one month prior to the date of surgery. We will give you a form for your primary physician to complete.  3. We make every effort to accommodate the date you request for surgery.  However, there are times where surgery dates or times have to be moved.  We will contact you as soon as possible if a change in schedule is required.   4. No aspirin/ibuprofen for one week before surgery.  If you are on aspirin, any non-steroidal anti-inflammatory medications (Mobic, Aleve, Ibuprofen) should not be taken seven (7) days prior to your surgery.  You make take Tylenol for pain prior to surgery.  5. Medications - If you are taking daily heart and blood pressure medications, seizure, reflux, allergy, asthma, anxiety, pain or diabetes medications, make sure you notify the surgery center/hospital before the day of surgery so they can tell you which medications you should take or avoid the day of surgery. 6. No food or drink after midnight the night before surgery unless directed otherwise by surgical center/hospital staff. 7. No alcoholic beverages 24-hours prior to surgery.  No smoking 24-hours prior or 24-hours after  surgery. 8. Wear loose pants or shorts. They should be loose enough to fit over bandages, boots, and casts. 9. Don't wear slip-on shoes. Sneakers are preferred. 10. Bring your boot with you to the surgery center/hospital.  Also bring crutches or a walker if your physician has prescribed it for you.  If you do not have this equipment, it will be provided for you after surgery. 11. If you have not been contacted by the surgery center/hospital by the day before your surgery, call to confirm the date and time of your surgery. 12. Leave-time from work may vary depending on the type of surgery you have.  Appropriate arrangements should be made prior to surgery with your employer. 13. Prescriptions will be provided immediately following surgery by your doctor.  Fill these as soon as possible after surgery and take the medication as directed. Pain medications will not be refilled on weekends and must be approved by the doctor. 14. Remove nail polish on the operative foot and avoid getting pedicures prior to surgery. 15. Wash the night before surgery.  The night before surgery wash the foot and leg well with water and the antibacterial soap provided. Be sure to pay special attention to beneath the toenails and in between the toes.  Wash for at least three (3) minutes. Rinse thoroughly with water and dry well with a towel.  Perform this wash unless told not to do so by your physician.  Enclosed: 1 Ice pack (please put in freezer the night before surgery)   1 Hibiclens skin cleaner     Pre-op instructions  If you have any questions regarding the instructions, please do not hesitate to call our office.  [place]: [address], , [place] [address] -- [phone]  [place]: [address] [place], [place] [address] -- [phone]  [place]: [address]  [place], [place] [address] -- [phone]  [place]: [address], [address], [place], [place] [address] -- [phone]  Website:  [email or web address] 

## 2017-01-24 NOTE — Progress Notes (Signed)
Subjective:    Patient ID: Natasha Chan, female   DOB: 76 y.o.   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 Patient wants surgery and at this time I allowed her to read consent form going over alternative treatments complications and everything as listed in the consent form. Patient is comfortable with this wants procedure signs consent form and is scheduled for outpatient surgery understanding the digits may not purchase the ground after the procedure. Total recovery take 6 months to one year which I educated her on and she is scheduled for outpatient surgery  X-rays indicate there is raw moderate rigid contracture of the lesser digits left with screws in place

## 2017-03-03 ENCOUNTER — Telehealth: Payer: Self-pay | Admitting: *Deleted

## 2017-03-03 NOTE — Telephone Encounter (Signed)
"  Dr. Charlsie Merlesegal is supposed to do my surgery on Tuesday, the 30th.  I haven't heard from anyone about a time or anything.  Can you give me a call?"  Aram BeechamCynthia [facility] called and informed her of arrival time.

## 2017-03-04 ENCOUNTER — Encounter: Payer: Self-pay | Admitting: Podiatry

## 2017-03-04 DIAGNOSIS — M2042 Other hammer toe(s) (acquired), left foot: Secondary | ICD-10-CM

## 2017-03-06 ENCOUNTER — Telehealth: Payer: Self-pay | Admitting: Podiatry

## 2017-03-06 ENCOUNTER — Other Ambulatory Visit: Payer: Self-pay | Admitting: Podiatry

## 2017-03-06 NOTE — Telephone Encounter (Signed)
I spoke with pt and discussed comfort measures.

## 2017-03-06 NOTE — Telephone Encounter (Signed)
Pain Medication is not helping, per Dr. Logan BoresEvans she was to double up on medication but she said its still not helping. Mrs. Natasha Chan said she has not been able to sleep all night.

## 2017-03-06 NOTE — Telephone Encounter (Signed)
I had surgery by Dr. Charlsie Merlesegal on Tuesday. I have not been able to sleep all night and I am unable to relax to go to sleep. I was wondering I could get something stronger than the Demerol to help since I'm having such a hard time. Please call me back at [phone].

## 2017-03-06 NOTE — Telephone Encounter (Signed)
Pt states she hasn't slept since yesterday morning and she spoke with Dr. Logan BoresEvans and he said she could take Demerol 2 tablets every 4 hours but not every time, is still having sharp pain. I told pt to take the surgery boot, open-ended sock, and ace wrap, and pt states she has done that and loosened the ace wrap, is elevating and icing. I told pt to take the ace wrap totally off and dangle to foot for 15 minutes, then place the foot level with the hip and rewrap the ace looser beginning at the toes and wrapping up the leg, reapply the sock and the boot, and continue to elevate. I told pt, if she tolerated OTC ibuprofen she could take 2 every 4-6 hours as pack instructed, in between the dosing of the Demerol. Pt states she will.

## 2017-03-07 ENCOUNTER — Ambulatory Visit (INDEPENDENT_AMBULATORY_CARE_PROVIDER_SITE_OTHER): Payer: Medicare Other

## 2017-03-07 ENCOUNTER — Ambulatory Visit (INDEPENDENT_AMBULATORY_CARE_PROVIDER_SITE_OTHER): Payer: Medicare Other | Admitting: Sports Medicine

## 2017-03-07 DIAGNOSIS — M2042 Other hammer toe(s) (acquired), left foot: Secondary | ICD-10-CM | POA: Diagnosis not present

## 2017-03-07 DIAGNOSIS — M79672 Pain in left foot: Secondary | ICD-10-CM

## 2017-03-07 DIAGNOSIS — Z9889 Other specified postprocedural states: Secondary | ICD-10-CM

## 2017-03-07 MED ORDER — HYDROMORPHONE HCL 4 MG PO TABS: 4.0000 mg | ORAL_TABLET | Freq: Four times a day (QID) | ORAL | 0 refills | Status: DC | PRN

## 2017-03-07 NOTE — Telephone Encounter (Signed)
I'm calling back because I'm still in a lot of pain. I was wondering if something stronger could be prescribed for me and I could get it in the [place] office. This is just not doing it. Please call me back at [phone].

## 2017-03-07 NOTE — Telephone Encounter (Signed)
Pt was seen in [place] today by Dr. Marylene LandStover and prescribed Dilaudid 4mg .

## 2017-03-07 NOTE — Progress Notes (Signed)
Subjective: Natasha Chan is a 76 y.o. female patient seen today in office for POV #1 (DOS 03-04-17), S/P Left hammertoe repair the kwire by Dr. Charlsie Merlesegal. Patient states that she is in a lot of pain and feels like the Demerol is not helping; patient reports that her pain last night was 8 out of 10 so she call the on-call doctor who is Dr. Logan BoresEvans who instructed her to loosen her dressing states that the pain still did not ease up so she called about coming in to be seen today.  Patient states that her last dose of Demerol was at 4 AM and now her pain is about 5 out of 10, patient denies calf pain, denies headache, chest pain, shortness of breath, nausea, vomiting, fever, or chills. Patient states that she is doing everything like elevating icing and making sure she is keeping the dressings clean and dry and limiting her walking and standing with the use of postop shoe. No other issues noted.   Patient Active Problem List   Diagnosis Date Noted  . Rectus diastasis 02/17/2012    Current Outpatient Prescriptions on File Prior to Visit  Medication Sig Dispense Refill  . aluminum-magnesium hydroxide-simethicone (MAALOX) 200-200-20 MG/5ML SUSP Take 30 mLs by mouth 4 (four) times daily -  before meals and at bedtime. 355 mL 0  . cyanocobalamin (,VITAMIN B-12,) 1000 MCG/ML injection Inject 1 mL into the muscle every 30 (thirty) days.    Marland Kitchen. dicyclomine (BENTYL) 20 MG tablet Take 1 tablet (20 mg total) by mouth 2 (two) times daily. (Patient taking differently: Take 20 mg by mouth as needed. ) 20 tablet 0  . DULoxetine (CYMBALTA) 60 MG capsule Take 120 mg by mouth daily.     . furosemide (LASIX) 20 MG tablet Take 20 mg by mouth daily as needed for fluid.  0  . Olmesartan-Amlodipine-HCTZ (TRIBENZOR) 20-5-12.5 MG TABS Take 1 tablet by mouth daily.    . ondansetron (ZOFRAN) 4 MG tablet Take 1 tablet (4 mg total) by mouth every 8 (eight) hours as needed for nausea or vomiting. 20 tablet 0  . sitaGLIPtin (JANUVIA) 50  MG tablet Take 50 mg by mouth daily.    Marland Kitchen. thyroid (ARMOUR) 30 MG tablet Take 30 mg by mouth daily.     No current facility-administered medications on file prior to visit.     Allergies  Allergen Reactions  . Ativan [Lorazepam]     hyper  . Codeine Hives, Itching and Nausea Only  . Sulfur Hives    Objective: There were no vitals filed for this visit.  General: No acute distress, AAOx3  Right foot: K wire and sutures intact with no gapping or dehiscence at surgical sites, mild swelling to left forefoot, no erythema, no warmth, no drainage, no signs of infection noted, Capillary fill time <3 seconds in all digits, gross sensation present via light touch to left foot.  No pain with calf compression.   Post Op Xray, Left foot: K wires in good alignment and position.  Status post hammertoe repair.  Soft tissue swelling within normal limits for post op status.   Assessment and Plan:  Problem List Items Addressed This Visit    None    Visit Diagnoses    S/P foot surgery, left    -  Primary   Relevant Medications   HYDROmorphone (DILAUDID) 4 MG tablet   Left foot pain       Relevant Medications   HYDROmorphone (DILAUDID) 4 MG tablet  Hammertoe of left foot       Relevant Medications   HYDROmorphone (DILAUDID) 4 MG tablet   Other Relevant Orders   DG Foot Complete Left (Completed)       -Patient seen and evaluated -X-rays reviewed -Applied dry sterile dressing to surgical site left foot secured with ACE wrap and stockinet  -Advised patient to make sure to keep dressings clean, dry, and intact to left surgical site, adjusting the ACE as needed  -Change Demerol to Dilaudid to see if this will give patient better pain relief -Continue with Phenergan as needed for nausea and Colace if needed for constipation -Advised patient to continue with post-op shoe on left foot   -Advised patient to limit activity to necessity  -Advised patient to continue to ice and elevate  -Will plan  for patient to follow-up with Dr. Charlsie Merles as scheduled for continued postop care. In the meantime, patient to call office if any issues or problems arise.   Asencion Islam, DPM

## 2017-03-12 ENCOUNTER — Ambulatory Visit (INDEPENDENT_AMBULATORY_CARE_PROVIDER_SITE_OTHER): Payer: Medicare Other | Admitting: Podiatry

## 2017-03-12 ENCOUNTER — Encounter: Payer: Self-pay | Admitting: Podiatry

## 2017-03-12 ENCOUNTER — Ambulatory Visit: Payer: Medicare Other

## 2017-03-12 DIAGNOSIS — M2012 Hallux valgus (acquired), left foot: Secondary | ICD-10-CM

## 2017-03-12 DIAGNOSIS — M2042 Other hammer toe(s) (acquired), left foot: Secondary | ICD-10-CM

## 2017-03-12 NOTE — Progress Notes (Signed)
Subjective:    Patient ID: Lowella BandySylvia A Mcginley, female   DOB: 76 y.o.   MRN: [id number]   HPI patient states doing well with her toes    ROS      Objective:  Physical Exam neurovascular status intact with patient second third fourth digits left healing well wound edges coapted pins in place good alignment noted     Assessment:    Doing well post digital fusion digits 234 left     Plan:   Reapplied sterile dressing gave instructions on keeping the toes and position and reappoint in the next 2 weeks for stitch removal or earlier if needed

## 2017-03-19 ENCOUNTER — Ambulatory Visit (INDEPENDENT_AMBULATORY_CARE_PROVIDER_SITE_OTHER): Payer: Medicare Other | Admitting: Podiatry

## 2017-03-19 ENCOUNTER — Encounter: Payer: Self-pay | Admitting: Podiatry

## 2017-03-19 DIAGNOSIS — M2042 Other hammer toe(s) (acquired), left foot: Secondary | ICD-10-CM | POA: Diagnosis not present

## 2017-03-19 MED ORDER — HYDROCODONE-ACETAMINOPHEN 5-325 MG PO TABS: 1.0000 | ORAL_TABLET | Freq: Four times a day (QID) | ORAL | 0 refills | Status: DC | PRN

## 2017-03-20 NOTE — Progress Notes (Signed)
Subjective:    Patient ID: Natasha BandySylvia A Chan, female   DOB: 76 y.o.   MRN: [id number]   HPI patient states doing well with digits healing well with pins in place    ROS      Objective:  Physical Exam neurovascular status intact negative Homans sign was noted with second third fourth digits left and good alignment with wound edges well coapted stitches in place     Assessment:   Doing well post digital fusion digits 234 left with good digital alignment and pin position     Plan:  Stitches removed from the second third and fourth toes and I did apply a BioSkin brace to provide compression and to lower the second third fourth toes with instructions on plantar flexion of the digits. Patient be seen back 2 weeks for pin removal or earlier if needed

## 2017-03-26 ENCOUNTER — Other Ambulatory Visit: Payer: Medicare Other

## 2017-04-02 ENCOUNTER — Ambulatory Visit (INDEPENDENT_AMBULATORY_CARE_PROVIDER_SITE_OTHER): Payer: Self-pay | Admitting: Podiatry

## 2017-04-02 ENCOUNTER — Encounter: Payer: Self-pay | Admitting: Podiatry

## 2017-04-02 ENCOUNTER — Ambulatory Visit (INDEPENDENT_AMBULATORY_CARE_PROVIDER_SITE_OTHER): Payer: Medicare Other

## 2017-04-02 DIAGNOSIS — M2042 Other hammer toe(s) (acquired), left foot: Secondary | ICD-10-CM

## 2017-04-03 NOTE — Progress Notes (Signed)
Subjective:   Patient ID: Natasha Chan, female   DOB: 76 y.o.   MRN: [id number]   HPI Patient presents stating she is ready to get the pins out of her toes and the second pin already came out.  Patient points to third and fourth toes and states she is very happy with the alignment.   ROS      Objective:  Physical Exam  Neurovascular status intact with patient found to have good alignment second third and fourth toes with pins in place third and fourth digits and mild edema of the digits but overall in good alignment     Assessment:  Doing well post digital fusion digits 234 left with pins in place third and fourth toes     Plan:  H&P x-rays reviewed and today I went ahead and remove the pins third and fourth toes applied sterile dressings reviewed her x-rays and patient is instructed on continued elevation continued open toed shoes compression with gradual return to soft she is in the next several weeks.  Reappoint to recheck 4 weeks or earlier if necessary.  X-rays indicate that there is good alignment with no indications of significant pathology and good structural position of the underlying digit.

## 2017-08-19 ENCOUNTER — Other Ambulatory Visit: Payer: Self-pay | Admitting: Nephrology

## 2017-08-19 DIAGNOSIS — N183 Chronic kidney disease, stage 3 unspecified: Secondary | ICD-10-CM

## 2017-08-19 DIAGNOSIS — I129 Hypertensive chronic kidney disease with stage 1 through stage 4 chronic kidney disease, or unspecified chronic kidney disease: Secondary | ICD-10-CM

## 2017-08-19 DIAGNOSIS — M791 Myalgia, unspecified site: Secondary | ICD-10-CM

## 2017-08-20 ENCOUNTER — Ambulatory Visit
Admission: RE | Admit: 2017-08-20 | Discharge: 2017-08-20 | Disposition: A | Discharge status: Home / Self Care | Payer: Medicare Other | Source: Ambulatory Visit | Attending: Nephrology | Admitting: Nephrology

## 2017-08-20 DIAGNOSIS — N183 Chronic kidney disease, stage 3 unspecified: Secondary | ICD-10-CM

## 2017-08-20 DIAGNOSIS — M791 Myalgia, unspecified site: Secondary | ICD-10-CM

## 2017-08-20 DIAGNOSIS — I129 Hypertensive chronic kidney disease with stage 1 through stage 4 chronic kidney disease, or unspecified chronic kidney disease: Secondary | ICD-10-CM

## 2017-11-27 ENCOUNTER — Encounter: Payer: Self-pay | Admitting: Podiatry

## 2017-11-27 ENCOUNTER — Ambulatory Visit (INDEPENDENT_AMBULATORY_CARE_PROVIDER_SITE_OTHER): Payer: Medicare Other

## 2017-11-27 ENCOUNTER — Ambulatory Visit (INDEPENDENT_AMBULATORY_CARE_PROVIDER_SITE_OTHER): Payer: Medicare Other | Admitting: Podiatry

## 2017-11-27 DIAGNOSIS — M779 Enthesopathy, unspecified: Secondary | ICD-10-CM

## 2017-11-27 DIAGNOSIS — M2042 Other hammer toe(s) (acquired), left foot: Secondary | ICD-10-CM | POA: Diagnosis not present

## 2017-11-27 NOTE — Progress Notes (Signed)
Subjective:   Patient ID: Natasha Chan, female   DOB: 77 y.o.   MRN: [id number]   HPI Patient presents stating she is been having on and off discomfort in her left second toe and she has been diagnosed with gout and is unsure if this may be the problem   ROS      Objective:  Physical Exam  Neurovascular status intact with patient found to have inflammation of the second digit left at the inner phalangeal joint localized in nature     Assessment:  Possibility that this is a systemic disease or possible that this is an inflammatory localized condition     Plan:  H&P x-ray reviewed and at this point we will try cushioning for the toe with consideration for injection if symptoms get worse.  Patient will be seen back for us to recheck again depending on symptoms and will try cushion in her shoes  X-ray indicates that there does appear to be good healing of the second digit left with minimal indication of any form of delayed nonunion or pseudo-union

## 2019-12-01 IMAGING — US US RENAL
1 series · 14 of 25 positions shown · non-contrast
Comparison: Prior CT from 07/19/2017.

CLINICAL DATA: Initial evaluation for chronic kidney disease stage
3.

EXAM:
RENAL / URINARY TRACT ULTRASOUND COMPLETE

[Series 1: us renal · 0.25mm/px · 14 of 43 slices shown]
[im 1/43]
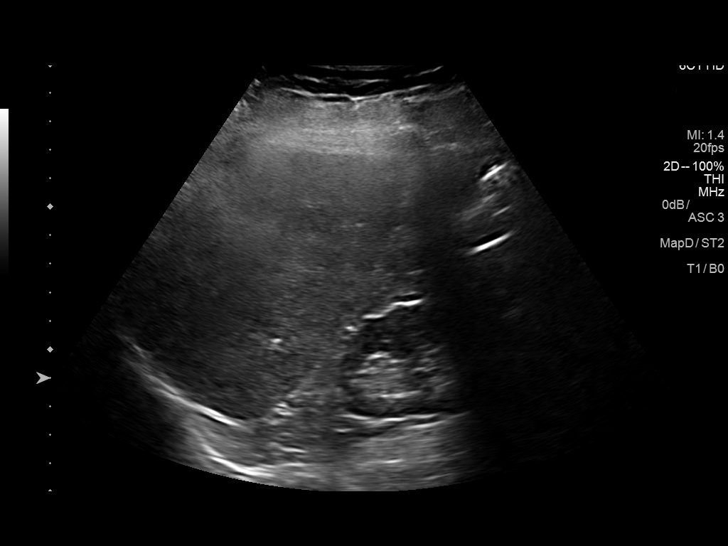
[im 4/43]
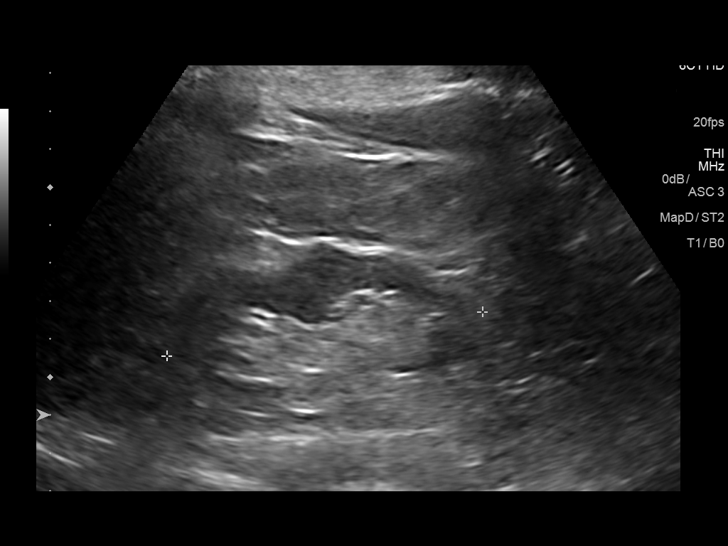
[im 8/43]
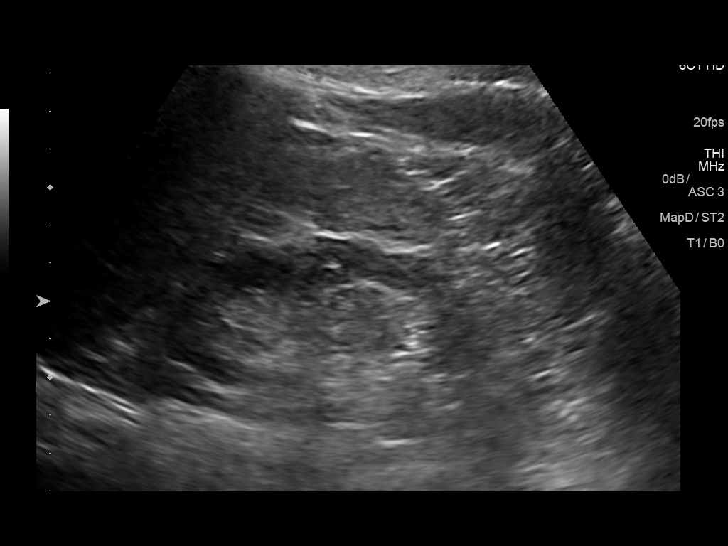
[im 11/43]
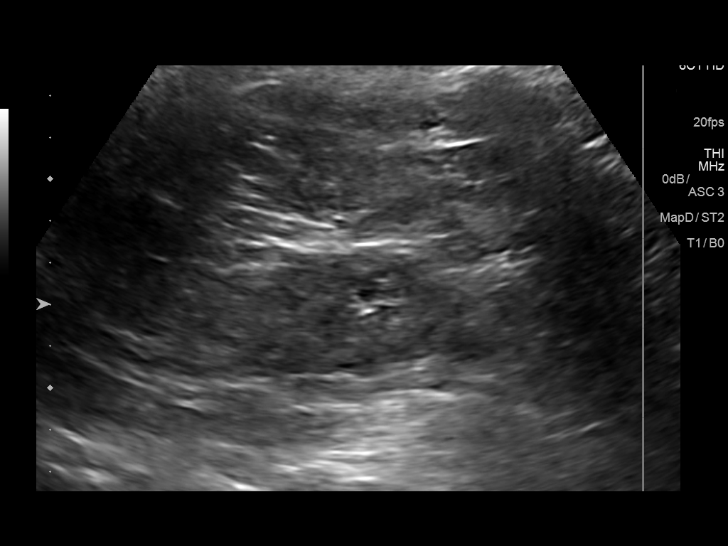
[im 15/43]
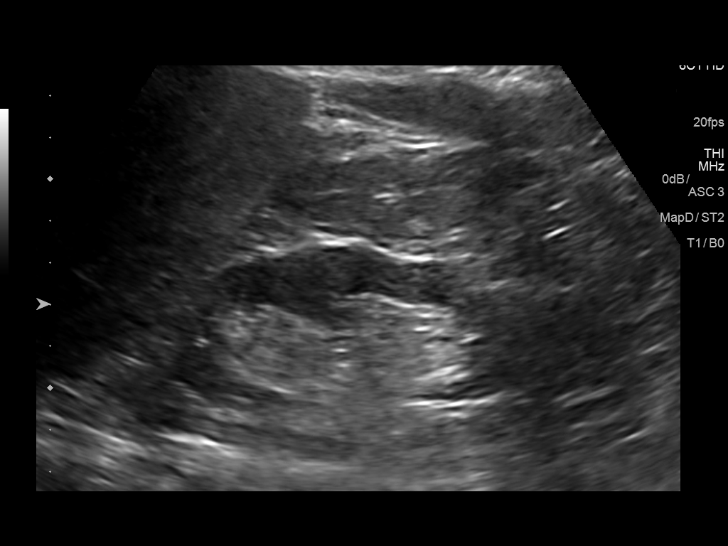
[im 16/43]
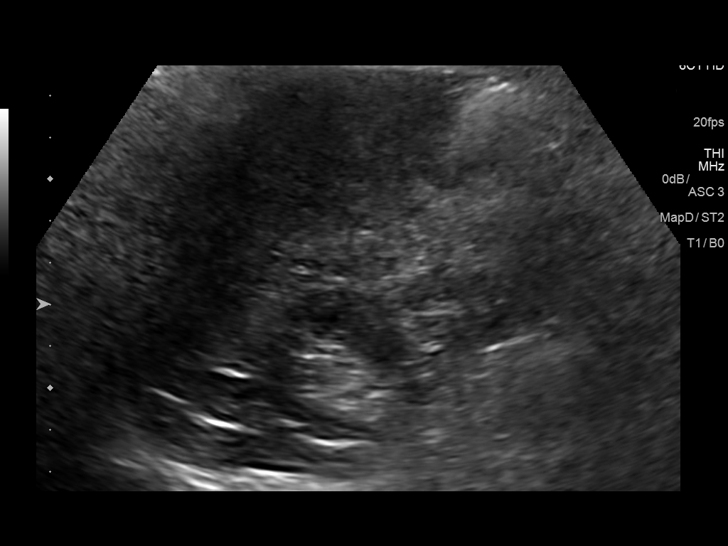
[im 20/43]
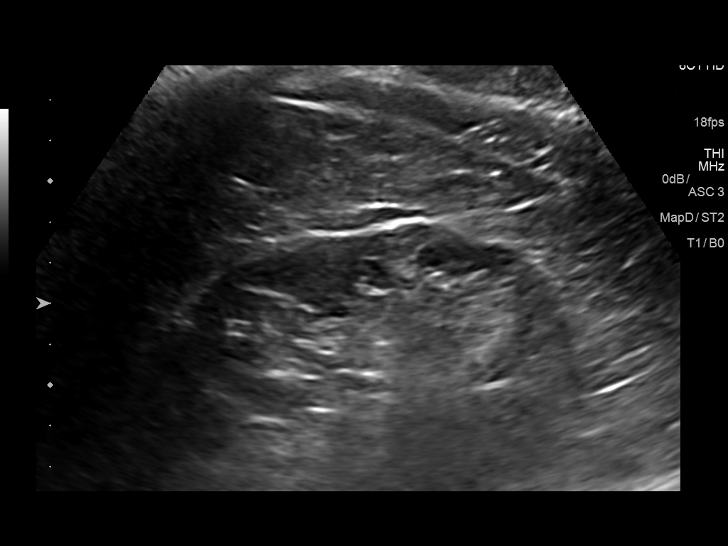
[im 23/43]
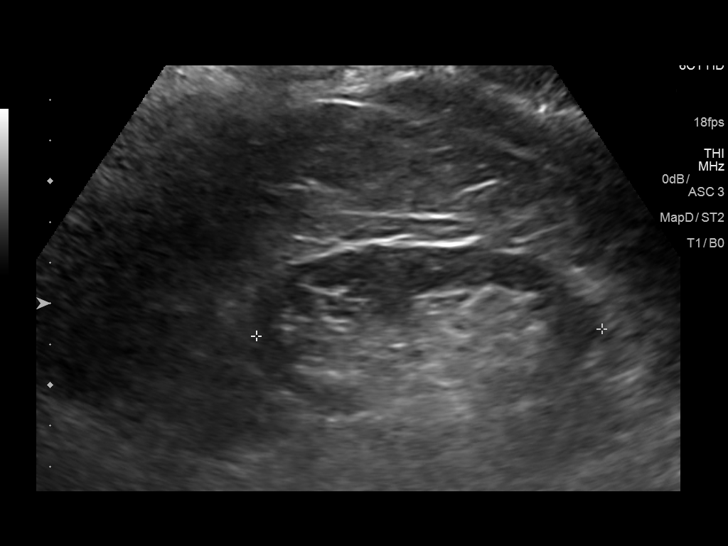
[im 27/43]
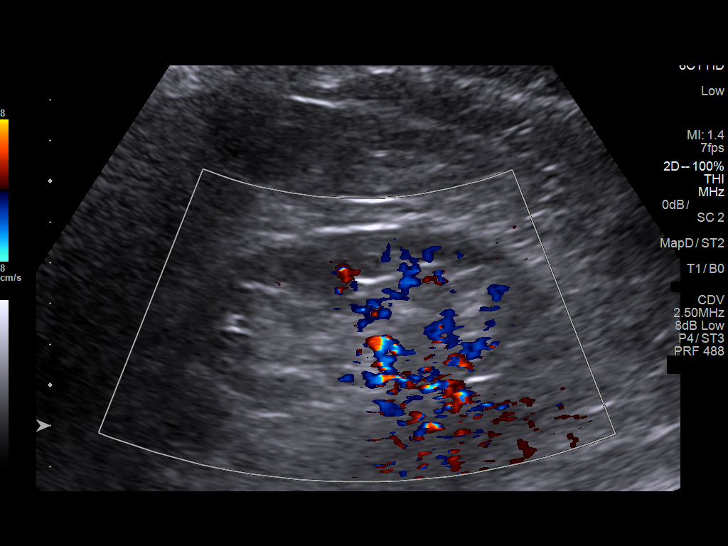
[im 29/43]
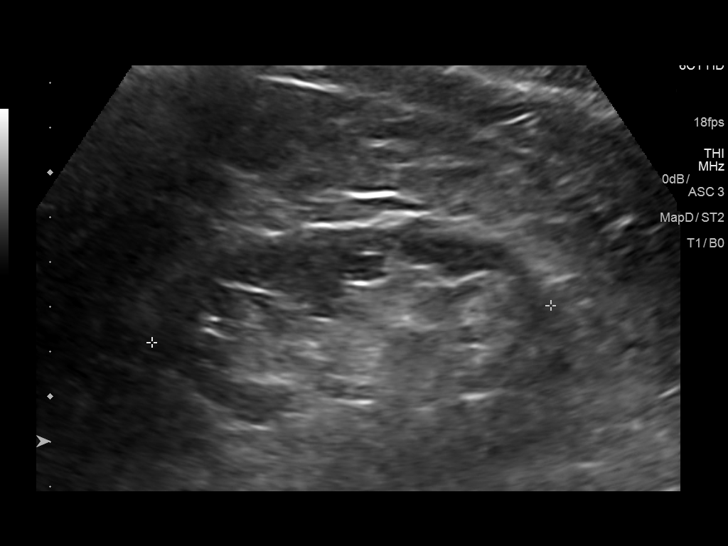
[im 32/43]
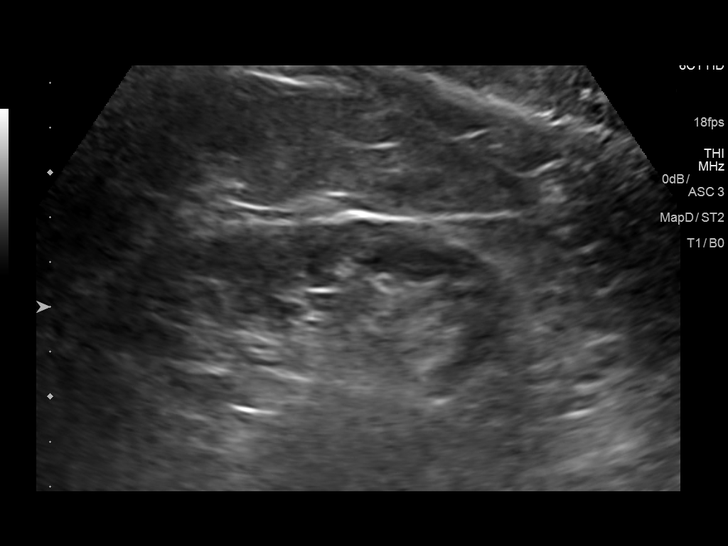
[im 36/43]
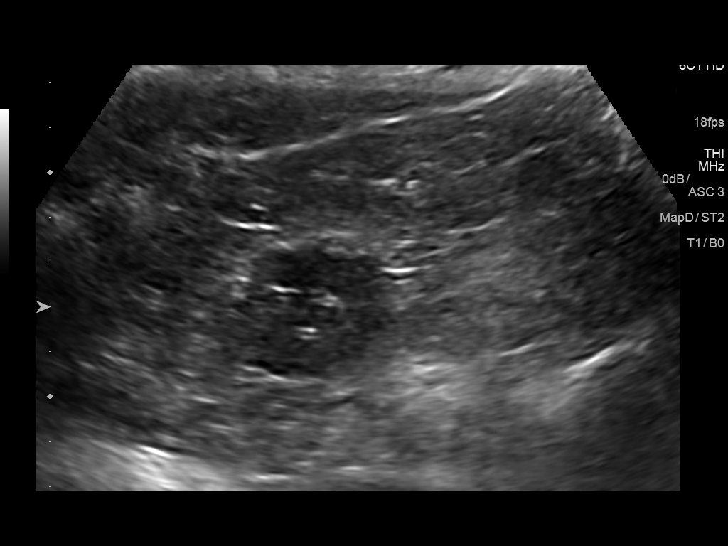
[im 39/43]
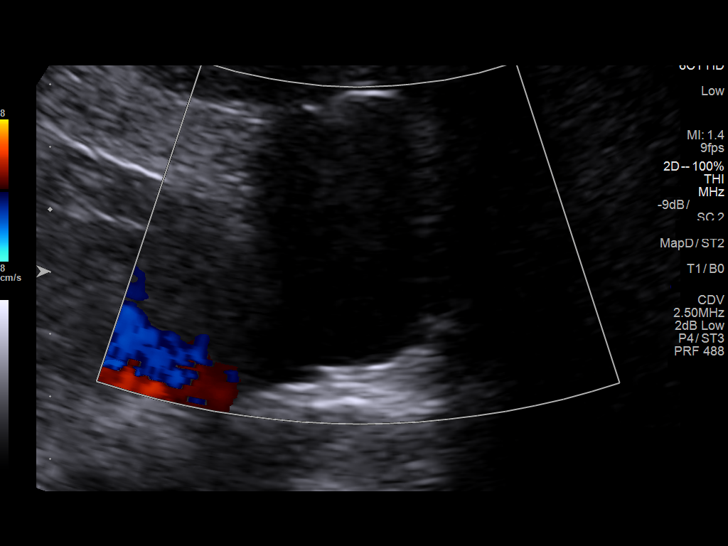
[im 43/43]
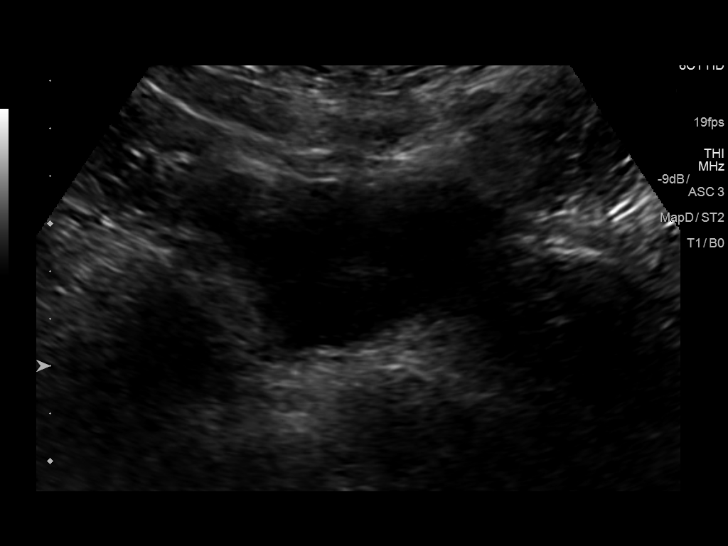

[14 of 25 positions shown; findings below may reference images not displayed]

FINDINGS: Right Kidney:

Length: 8.6 cm. Mild diffuse cortical thinning, most evident at the
lower pole. Mildly increased echogenicity within the renal
parenchyma, suggesting medical renal disease. Renal contour somewhat
lobulated. No mass or hydronephrosis visualized.

Left Kidney:

Length: 8.9 cm. Diffuse cortical thinning, most evident at the lower
pole. Mildly increased echogenicity within the renal parenchyma,
consistent with medical renal disease. No mass or hydronephrosis
visualized.

Bladder:

Appears normal for degree of bladder distention.
IMPRESSION: Diffuse cortical thinning with mildly increased echogenicity within
the renal parenchyma, compatible with chronic medical renal disease.
No hydronephrosis.

## 2020-03-28 ENCOUNTER — Encounter: Payer: Self-pay | Admitting: Ophthalmology

## 2020-03-28 ENCOUNTER — Other Ambulatory Visit: Payer: Self-pay

## 2020-03-29 NOTE — Discharge Instructions (Signed)

## 2020-03-31 ENCOUNTER — Other Ambulatory Visit: Payer: Self-pay

## 2020-03-31 ENCOUNTER — Other Ambulatory Visit
Admission: RE | Admit: 2020-03-31 | Discharge: 2020-03-31 | Disposition: A | Discharge status: Home / Self Care | Payer: Medicare Other | Source: Ambulatory Visit | Attending: Ophthalmology | Admitting: Ophthalmology

## 2020-03-31 DIAGNOSIS — Z01812 Encounter for preprocedural laboratory examination: Secondary | ICD-10-CM | POA: Insufficient documentation

## 2020-03-31 DIAGNOSIS — Z20822 Contact with and (suspected) exposure to covid-19: Secondary | ICD-10-CM | POA: Diagnosis not present

## 2020-04-01 LAB — SARS CORONAVIRUS 2 (TAT 6-24 HRS): SARS Coronavirus 2: NEGATIVE

## 2020-04-07 ENCOUNTER — Other Ambulatory Visit: Payer: Self-pay

## 2020-04-07 ENCOUNTER — Other Ambulatory Visit
Admission: RE | Admit: 2020-04-07 | Discharge: 2020-04-07 | Disposition: A | Discharge status: Home / Self Care | Payer: Medicare Other | Source: Ambulatory Visit | Attending: Ophthalmology | Admitting: Ophthalmology

## 2020-04-07 DIAGNOSIS — Z01812 Encounter for preprocedural laboratory examination: Secondary | ICD-10-CM | POA: Insufficient documentation

## 2020-04-07 DIAGNOSIS — Z20822 Contact with and (suspected) exposure to covid-19: Secondary | ICD-10-CM | POA: Diagnosis not present

## 2020-04-08 LAB — SARS CORONAVIRUS 2 (TAT 6-24 HRS): SARS Coronavirus 2: NEGATIVE

## 2020-04-11 ENCOUNTER — Ambulatory Visit
Admission: RE | Admit: 2020-04-11 | Discharge: 2020-04-11 | Disposition: A | Discharge status: Home / Self Care | Payer: Medicare Other | Attending: Ophthalmology | Admitting: Ophthalmology

## 2020-04-11 ENCOUNTER — Encounter
Admission: RE | Disposition: A | Discharge status: Home / Self Care | Payer: Self-pay | Source: Home / Self Care | Attending: Ophthalmology

## 2020-04-11 ENCOUNTER — Ambulatory Visit: Payer: Medicare Other | Admitting: Anesthesiology

## 2020-04-11 ENCOUNTER — Other Ambulatory Visit: Payer: Self-pay

## 2020-04-11 ENCOUNTER — Encounter: Payer: Self-pay | Admitting: Ophthalmology

## 2020-04-11 DIAGNOSIS — Z79899 Other long term (current) drug therapy: Secondary | ICD-10-CM | POA: Diagnosis not present

## 2020-04-11 DIAGNOSIS — Z882 Allergy status to sulfonamides status: Secondary | ICD-10-CM | POA: Diagnosis not present

## 2020-04-11 DIAGNOSIS — Z888 Allergy status to other drugs, medicaments and biological substances status: Secondary | ICD-10-CM | POA: Diagnosis not present

## 2020-04-11 DIAGNOSIS — Z885 Allergy status to narcotic agent status: Secondary | ICD-10-CM | POA: Insufficient documentation

## 2020-04-11 DIAGNOSIS — E1136 Type 2 diabetes mellitus with diabetic cataract: Secondary | ICD-10-CM | POA: Diagnosis not present

## 2020-04-11 DIAGNOSIS — H2512 Age-related nuclear cataract, left eye: Secondary | ICD-10-CM | POA: Insufficient documentation

## 2020-04-11 HISTORY — DX: Unspecified osteoarthritis, unspecified site: M19.90

## 2020-04-11 HISTORY — DX: Nausea with vomiting, unspecified: R11.2

## 2020-04-11 HISTORY — DX: Other specified postprocedural states: Z98.890

## 2020-04-11 HISTORY — DX: Fibromyalgia: M79.7

## 2020-04-11 HISTORY — PX: CATARACT EXTRACTION W/PHACO: SHX586

## 2020-04-11 HISTORY — DX: Presence of dental prosthetic device (complete) (partial): Z97.2

## 2020-04-11 LAB — GLUCOSE, CAPILLARY: Glucose-Capillary: 89 mg/dL (ref 70–99)

## 2020-04-11 SURGERY — PHACOEMULSIFICATION, CATARACT, WITH IOL INSERTION
Anesthesia: Monitor Anesthesia Care | Laterality: Left

## 2020-04-11 MED ORDER — FENTANYL CITRATE (PF) 100 MCG/2ML IJ SOLN
INTRAMUSCULAR | Status: DC | PRN
  Administered 2020-04-11 (×2): 50 ug via INTRAVENOUS

## 2020-04-11 MED ORDER — BRIMONIDINE TARTRATE-TIMOLOL 0.2-0.5 % OP SOLN
OPHTHALMIC | Status: DC | PRN
  Administered 2020-04-11: 1 [drp] via OPHTHALMIC

## 2020-04-11 MED ORDER — TETRACAINE HCL 0.5 % OP SOLN
1.0000 [drp] | OPHTHALMIC | Status: DC | PRN
  Administered 2020-04-11 (×3): 1 [drp] via OPHTHALMIC

## 2020-04-11 MED ORDER — NA CHONDROIT SULF-NA HYALURON 40-17 MG/ML IO SOLN
INTRAOCULAR | Status: DC | PRN
  Administered 2020-04-11: 1 mL via INTRAOCULAR

## 2020-04-11 MED ORDER — LIDOCAINE HCL (PF) 2 % IJ SOLN
INTRAOCULAR | Status: DC | PRN
  Administered 2020-04-11: 1 mL

## 2020-04-11 MED ORDER — MIDAZOLAM HCL 2 MG/2ML IJ SOLN
INTRAMUSCULAR | Status: DC | PRN
  Administered 2020-04-11: 2 mg via INTRAVENOUS

## 2020-04-11 MED ORDER — EPINEPHRINE PF 1 MG/ML IJ SOLN
INTRAOCULAR | Status: DC | PRN
  Administered 2020-04-11: 49 mL via OPHTHALMIC

## 2020-04-11 MED ORDER — MOXIFLOXACIN HCL 0.5 % OP SOLN
OPHTHALMIC | Status: DC | PRN
  Administered 2020-04-11: 0.2 mL via OPHTHALMIC

## 2020-04-11 MED ORDER — ARMC OPHTHALMIC DILATING DROPS
1.0000 "application " | OPHTHALMIC | Status: DC | PRN
  Administered 2020-04-11 (×3): 1 via OPHTHALMIC

## 2020-04-11 SURGICAL SUPPLY — 21 items
CANNULA ANT/CHMB 27G (MISCELLANEOUS) ×2 IMPLANT
CANNULA ANT/CHMB 27GA (MISCELLANEOUS) ×6 IMPLANT
GLOVE SURG LX 8.0 MICRO (GLOVE) ×2
GLOVE SURG LX STRL 8.0 MICRO (GLOVE) ×1 IMPLANT
GLOVE SURG TRIUMPH 8.0 PF LTX (GLOVE) ×3 IMPLANT
GOWN STRL REUS W/ TWL LRG LVL3 (GOWN DISPOSABLE) ×2 IMPLANT
GOWN STRL REUS W/TWL LRG LVL3 (GOWN DISPOSABLE) ×6
LENS IOL ACRSF VT TRC 315 24.5 IMPLANT
LENS IOL ACRYSOF VIVITY 24.5 ×3 IMPLANT
LENS IOL VIVITY 315 24.5 ×1 IMPLANT
MARKER SKIN DUAL TIP RULER LAB (MISCELLANEOUS) ×3 IMPLANT
NDL FILTER BLUNT 18X1 1/2 (NEEDLE) ×1 IMPLANT
NEEDLE FILTER BLUNT 18X 1/2SAF (NEEDLE) ×2
NEEDLE FILTER BLUNT 18X1 1/2 (NEEDLE) ×1 IMPLANT
PACK EYE AFTER SURG (MISCELLANEOUS) ×3 IMPLANT
PACK OPTHALMIC (MISCELLANEOUS) ×3 IMPLANT
PACK PORFILIO (MISCELLANEOUS) ×3 IMPLANT
SYR 3ML LL SCALE MARK (SYRINGE) ×3 IMPLANT
SYR TB 1ML LUER SLIP (SYRINGE) ×3 IMPLANT
WATER STERILE IRR 250ML POUR (IV SOLUTION) ×3 IMPLANT
WIPE NON LINTING 3.25X3.25 (MISCELLANEOUS) ×3 IMPLANT

## 2020-04-11 NOTE — Anesthesia Preprocedure Evaluation (Signed)
Anesthesia Evaluation  Patient identified by MRN, date of birth, ID band Patient awake    Reviewed: Allergy & Precautions, NPO status   History of Anesthesia Complications (+) PONV  Airway Mallampati: II  TM Distance: >3 FB     Dental   Pulmonary    breath sounds clear to auscultation       Cardiovascular hypertension,  Rhythm:Regular Rate:Normal  HLD   Neuro/Psych    GI/Hepatic   Endo/Other  diabetesHypothyroidism   Renal/GU      Musculoskeletal  (+) Arthritis , Fibromyalgia -  Abdominal   Peds  Hematology   Anesthesia Other Findings   Reproductive/Obstetrics                             Anesthesia Physical Anesthesia Plan  ASA: II  Anesthesia Plan: MAC   Post-op Pain Management:    Induction: Intravenous  PONV Risk Score and Plan: TIVA, Midazolam and Treatment may vary due to age or medical condition  Airway Management Planned: Natural Airway and Nasal Cannula  Additional Equipment:   Intra-op Plan:   Post-operative Plan:   Informed Consent: I have reviewed the patients History and Physical, chart, labs and discussed the procedure including the risks, benefits and alternatives for the proposed anesthesia with the patient or authorized representative who has indicated his/her understanding and acceptance.       Plan Discussed with: CRNA  Anesthesia Plan Comments:         Anesthesia Quick Evaluation

## 2020-04-11 NOTE — Transfer of Care (Signed)
Immediate Anesthesia Transfer of Care Note  Patient: Natasha Chan  Procedure(s) Performed: CATARACT EXTRACTION PHACO AND INTRAOCULAR LENS PLACEMENT (IOC) LEFT DIABETIC VIVITY  TORIC LENS 10.19 00:57.6 (Left )  Patient Location: PACU  Anesthesia Type: MAC  Level of Consciousness: awake, alert  and patient cooperative  Airway and Oxygen Therapy: Patient Spontanous Breathing and Patient connected to supplemental oxygen  Post-op Assessment: Post-op Vital signs reviewed, Patient's Cardiovascular Status Stable, Respiratory Function Stable, Patent Airway and No signs of Nausea or vomiting  Post-op Vital Signs: Reviewed and stable  Complications: No complications documented.

## 2020-04-11 NOTE — Anesthesia Postprocedure Evaluation (Signed)
Anesthesia Post Note  Patient: Natasha Chan  Procedure(s) Performed: CATARACT EXTRACTION PHACO AND INTRAOCULAR LENS PLACEMENT (IOC) LEFT DIABETIC VIVITY  TORIC LENS 10.19 00:57.6 (Left )     Patient location during evaluation: PACU Anesthesia Type: MAC Level of consciousness: awake Pain management: pain level controlled Vital Signs Assessment: post-procedure vital signs reviewed and stable Respiratory status: respiratory function stable Cardiovascular status: stable Postop Assessment: no apparent nausea or vomiting Anesthetic complications: no   No complications documented.  Veda Canning

## 2020-04-11 NOTE — Op Note (Signed)
PREOPERATIVE DIAGNOSIS:  Nuclear sclerotic cataract of the left eye.   POSTOPERATIVE DIAGNOSIS:  Nuclear sclerotic cataract of the left eye.   OPERATIVE PROCEDURE: Procedure(s): CATARACT EXTRACTION PHACO AND INTRAOCULAR LENS PLACEMENT (IOC) LEFT DIABETIC VIVITY  TORIC LENS 10.19 00:57.6   SURGEON:  Galen Manila, MD.   ANESTHESIA: 1.      Managed anesthesia care. 2.     0.23ml os Shugarcaine was instilled following the paracentesis [email or web address]   COMPLICATIONS:  None.   TECHNIQUE:   Stop and chop    DESCRIPTION OF PROCEDURE:  The patient was examined and consented in the preoperative holding area where the aforementioned topical anesthesia was applied to the left eye.  The patient was brought back to the Operating Room where he was sat upright on the gurney and given a target to fixate upon while the eye was marked at the 3:00 and 9:00 position.  The patient was then reclined on the operating table.  The eye was prepped and draped in the usual sterile ophthalmic fashion and a lid speculum was placed. A paracentesis was created with the side port blade and the anterior chamber was filled with viscoelastic. A near clear corneal incision was performed with the steel keratome. A continuous curvilinear capsulorrhexis was performed with a cystotome followed by the capsulorrhexis forceps. Hydrodissection and hydrodelineation were carried out with BSS on a blunt cannula. The lens was removed in a stop and chop technique and the remaining cortical material was removed with the irrigation-aspiration handpiece. The eye was inflated with viscoelastic and the DFT ens was placed in the eye and rotated to within a few degrees of the predetermined orientation.  The remaining viscoelastic was removed from the eye.  The Sinskey hook was used to rotate the toric lens into its final resting place at 179 degrees.  0.1 ml of Vigamox was placed in the anterior chamber. The eye was inflated to a physiologic pressure  and found to be watertight.  The eye was dressed with Vigamox. The patient was given protective glasses to wear throughout the day and a shield with which to sleep tonight. The patient was also given drops with which to begin a drop regimen today and will follow-up with me in one day. Implant Name Type Inv. Item Serial No. Manufacturer Lot No. LRB No. Used Action  LENS IOL ACRYSOF VIVITY 24.5 - [id number]  LENS IOL ACRYSOF VIVITY 24.5 [id number] ALCON  Left 1 Implanted   Procedure(s) with comments: CATARACT EXTRACTION PHACO AND INTRAOCULAR LENS PLACEMENT (IOC) LEFT DIABETIC VIVITY  TORIC LENS 10.19 00:57.6 (Left) - Diabetic - oral meds  Electronically signed: Galen Manila 12/7/202110:01 AM

## 2020-04-11 NOTE — H&P (Signed)
[facility]   Primary Care Physician:  Galvin Proffer, MD Ophthalmologist: Dr. Druscilla Brownie  Pre-Procedure History & Physical: HPI:  Natasha Chan is a 79 y.o. female here for cataract surgery.   Past Medical History:  Diagnosis Date  . Arthritis    left shoulder  . Diabetes mellitus without complication (HCC)   . Fibromyalgia   . Hypertension   . PONV (postoperative nausea and vomiting)   . Pulmonary embolism (HCC)   . Thyroid disease   . Wears dentures    full upper    Past Surgical History:  Procedure Laterality Date  . ABDOMINAL HYSTERECTOMY    . ABDOMINAL SURGERY    . APPENDECTOMY    . BUNIONECTOMY    . CHOLECYSTECTOMY    . COLON RESECTION    . HERNIA REPAIR    . KNEE SURGERY      Prior to Admission medications   Medication Sig Start Date End Date Taking? Authorizing Provider  allopurinol (ZYLOPRIM) 100 MG tablet Take by mouth. 08/21/17  Yes [provider]  busPIRone (BUSPAR) 10 MG tablet Take 10 mg by mouth 2 (two) times daily.   Yes [provider]  Cholecalciferol (VITAMIN D3) 1.25 MG (50000 UT) TABS Take by mouth once a week.   Yes [provider]  cyanocobalamin (,VITAMIN B-12,) 1000 MCG/ML injection Inject 1 mL into the muscle every 30 (thirty) days. 03/24/12  Yes [provider]  dicyclomine (BENTYL) 20 MG tablet Take 1 tablet (20 mg total) by mouth 2 (two) times daily. Patient taking differently: Take 20 mg by mouth as needed.  04/28/16  Yes Harris, Abigail, PA-C  DULoxetine (CYMBALTA) 60 MG capsule Take 120 mg by mouth daily.    Yes [provider]  furosemide (LASIX) 20 MG tablet Take 20 mg by mouth daily as needed for fluid. 02/23/16  Yes [provider]  liraglutide (VICTOZA) 18 MG/3ML SOPN Inject into the skin.   Yes [provider]  Olmesartan-Amlodipine-HCTZ (TRIBENZOR) 20-5-12.5 MG TABS Take 1 tablet by mouth daily. 10/16/15  Yes [provider]  ondansetron (ZOFRAN) 4 MG  tablet Take 1 tablet (4 mg total) by mouth every 8 (eight) hours as needed for nausea or vomiting. 05/10/16  Yes Regal, Kirstie Peri, DPM  potassium chloride (K-DUR,KLOR-CON) 10 MEQ tablet 20 mEq daily.  09/22/17  Yes [provider]  pravastatin (PRAVACHOL) 20 MG tablet Take by mouth. 11/05/16  Yes [provider]  QUEtiapine (SEROQUEL) 50 MG tablet Take 50 mg by mouth at bedtime.   Yes [provider]  sitaGLIPtin (JANUVIA) 50 MG tablet Take 50 mg by mouth daily. 10/12/15  Yes [provider]  thyroid (ARMOUR) 30 MG tablet Take 30 mg by mouth daily.   Yes [provider]    Allergies as of 02/16/2020 - Review Complete 11/27/2017  Allergen Reaction Noted  . Ativan [lorazepam]  02/17/2012  . Codeine Hives, Itching, and Nausea Only 02/17/2012  . Sulfur Hives 02/17/2012    History reviewed. No pertinent family history.  Social History   Socioeconomic History  . Marital status: Married    Spouse name: Not on file  . Number of children: Not on file  . Years of education: Not on file  . Highest education level: Not on file  Occupational History  . Not on file  Tobacco Use  . Smoking status: Never Smoker  . Smokeless tobacco: Never Used  Vaping Use  . Vaping Use: Never used  Substance and  Sexual Activity  . Alcohol use: Yes    Comment: occasional  . Drug use: No  . Sexual activity: Not on file  Other Topics Concern  . Not on file  Social History Narrative  . Not on file   Social Determinants of Health   Financial Resource Strain:   . Difficulty of Paying Living Expenses: Not on file  Food Insecurity:   . Worried About Programme researcher, broadcasting/film/video in the Last Year: Not on file  . Ran Out of Food in the Last Year: Not on file  Transportation Needs:   . Lack of Transportation (Medical): Not on file  . Lack of Transportation (Non-Medical): Not on file  Physical Activity:   . Days of Exercise per Week: Not on file  . Minutes of Exercise per Session:  Not on file  Stress:   . Feeling of Stress : Not on file  Social Connections:   . Frequency of Communication with Friends and Family: Not on file  . Frequency of Social Gatherings with Friends and Family: Not on file  . Attends Religious Services: Not on file  . Active Member of Clubs or Organizations: Not on file  . Attends Banker Meetings: Not on file  . Marital Status: Not on file  Intimate Partner Violence:   . Fear of Current or Ex-Partner: Not on file  . Emotionally Abused: Not on file  . Physically Abused: Not on file  . Sexually Abused: Not on file    Review of Systems: See HPI, otherwise negative ROS  Physical Exam: BP (!) 143/95   Pulse 80   Temp 98.1 F (36.7 C) (Temporal)   Ht 5\' 5"  (1.651 m)   Wt 83.5 kg   SpO2 97%   BMI 30.62 kg/m  General:   Alert,  pleasant and cooperative in NAD Head:  Normocephalic and atraumatic. Respiratory:  Normal work of breathing. Heart:  Regular rate and rhythm.   Impression/Plan: Natasha Chan is here for cataract surgery.  Risks, benefits, limitations, and alternatives regarding cataract surgery have been reviewed with the patient.  Questions have been answered.  All parties agreeable.   [name], MD  04/11/2020, 9:33 AM

## 2020-04-11 NOTE — Anesthesia Procedure Notes (Signed)
Procedure Name: MAC Date/Time: 04/11/2020 9:50 AM Performed by: Jeannene Patella, CRNA Pre-anesthesia Checklist: Patient identified, Emergency Drugs available, Suction available, Timeout performed and Patient being monitored Patient Re-evaluated:Patient Re-evaluated prior to induction Oxygen Delivery Method: Nasal cannula Placement Confirmation: positive ETCO2

## 2020-04-12 ENCOUNTER — Encounter: Payer: Self-pay | Admitting: Ophthalmology

## 2020-04-20 ENCOUNTER — Other Ambulatory Visit: Payer: Self-pay

## 2020-04-20 ENCOUNTER — Encounter: Payer: Self-pay | Admitting: Ophthalmology

## 2020-04-21 ENCOUNTER — Other Ambulatory Visit
Admission: RE | Admit: 2020-04-21 | Discharge: 2020-04-21 | Disposition: A | Discharge status: Home / Self Care | Payer: Medicare Other | Source: Ambulatory Visit | Attending: Ophthalmology | Admitting: Ophthalmology

## 2020-04-21 DIAGNOSIS — Z20822 Contact with and (suspected) exposure to covid-19: Secondary | ICD-10-CM | POA: Insufficient documentation

## 2020-04-21 DIAGNOSIS — Z01812 Encounter for preprocedural laboratory examination: Secondary | ICD-10-CM | POA: Diagnosis present

## 2020-04-21 NOTE — Discharge Instructions (Signed)

## 2020-04-22 LAB — SARS CORONAVIRUS 2 (TAT 6-24 HRS): SARS Coronavirus 2: NEGATIVE

## 2020-04-25 ENCOUNTER — Ambulatory Visit
Admission: RE | Admit: 2020-04-25 | Discharge: 2020-04-25 | Disposition: A | Discharge status: Home / Self Care | Payer: Medicare Other | Attending: Ophthalmology | Admitting: Ophthalmology

## 2020-04-25 ENCOUNTER — Other Ambulatory Visit: Payer: Self-pay

## 2020-04-25 ENCOUNTER — Ambulatory Visit: Payer: Medicare Other | Admitting: Anesthesiology

## 2020-04-25 ENCOUNTER — Encounter: Payer: Self-pay | Admitting: Ophthalmology

## 2020-04-25 ENCOUNTER — Encounter
Admission: RE | Disposition: A | Discharge status: Home / Self Care | Payer: Self-pay | Source: Home / Self Care | Attending: Ophthalmology

## 2020-04-25 DIAGNOSIS — Z882 Allergy status to sulfonamides status: Secondary | ICD-10-CM | POA: Insufficient documentation

## 2020-04-25 DIAGNOSIS — Z79890 Hormone replacement therapy (postmenopausal): Secondary | ICD-10-CM | POA: Diagnosis not present

## 2020-04-25 DIAGNOSIS — Z7984 Long term (current) use of oral hypoglycemic drugs: Secondary | ICD-10-CM | POA: Diagnosis not present

## 2020-04-25 DIAGNOSIS — H2511 Age-related nuclear cataract, right eye: Secondary | ICD-10-CM | POA: Diagnosis not present

## 2020-04-25 DIAGNOSIS — Z79899 Other long term (current) drug therapy: Secondary | ICD-10-CM | POA: Diagnosis not present

## 2020-04-25 DIAGNOSIS — Z888 Allergy status to other drugs, medicaments and biological substances status: Secondary | ICD-10-CM | POA: Diagnosis not present

## 2020-04-25 DIAGNOSIS — Z885 Allergy status to narcotic agent status: Secondary | ICD-10-CM | POA: Insufficient documentation

## 2020-04-25 HISTORY — PX: CATARACT EXTRACTION W/PHACO: SHX586

## 2020-04-25 LAB — GLUCOSE, CAPILLARY
Glucose-Capillary: 98 mg/dL (ref 70–99)
Glucose-Capillary: 103 mg/dL — ABNORMAL HIGH (ref 70–99)

## 2020-04-25 SURGERY — PHACOEMULSIFICATION, CATARACT, WITH IOL INSERTION
Anesthesia: Monitor Anesthesia Care | Site: Eye | Laterality: Right

## 2020-04-25 MED ORDER — MOXIFLOXACIN HCL 0.5 % OP SOLN
OPHTHALMIC | Status: DC | PRN
  Administered 2020-04-25: 0.2 mL via OPHTHALMIC

## 2020-04-25 MED ORDER — EPINEPHRINE PF 1 MG/ML IJ SOLN
INTRAOCULAR | Status: DC | PRN
  Administered 2020-04-25: 10:00:00 42 mL via OPHTHALMIC

## 2020-04-25 MED ORDER — NA CHONDROIT SULF-NA HYALURON 40-17 MG/ML IO SOLN
INTRAOCULAR | Status: DC | PRN
  Administered 2020-04-25: 1 mL via INTRAOCULAR

## 2020-04-25 MED ORDER — LIDOCAINE HCL (PF) 2 % IJ SOLN
INTRAOCULAR | Status: DC | PRN
  Administered 2020-04-25: 1 mL

## 2020-04-25 MED ORDER — BRIMONIDINE TARTRATE-TIMOLOL 0.2-0.5 % OP SOLN
OPHTHALMIC | Status: DC | PRN
  Administered 2020-04-25: 1 [drp] via OPHTHALMIC

## 2020-04-25 MED ORDER — FENTANYL CITRATE (PF) 100 MCG/2ML IJ SOLN
INTRAMUSCULAR | Status: DC | PRN
  Administered 2020-04-25: 50 ug via INTRAVENOUS

## 2020-04-25 MED ORDER — TETRACAINE HCL 0.5 % OP SOLN
1.0000 [drp] | OPHTHALMIC | Status: DC | PRN
  Administered 2020-04-25 (×3): 1 [drp] via OPHTHALMIC

## 2020-04-25 MED ORDER — ARMC OPHTHALMIC DILATING DROPS
1.0000 "application " | OPHTHALMIC | Status: DC | PRN
  Administered 2020-04-25 (×3): 1 via OPHTHALMIC

## 2020-04-25 MED ORDER — MIDAZOLAM HCL 2 MG/2ML IJ SOLN
INTRAMUSCULAR | Status: DC | PRN
  Administered 2020-04-25: 1.5 mg via INTRAVENOUS

## 2020-04-25 SURGICAL SUPPLY — 21 items
CANNULA ANT/CHMB 27G (MISCELLANEOUS) ×2 IMPLANT
CANNULA ANT/CHMB 27GA (MISCELLANEOUS) ×6 IMPLANT
GLOVE SURG LX 8.0 MICRO (GLOVE) ×2
GLOVE SURG LX STRL 8.0 MICRO (GLOVE) ×1 IMPLANT
GLOVE SURG TRIUMPH 8.0 PF LTX (GLOVE) ×3 IMPLANT
GOWN STRL REUS W/ TWL LRG LVL3 (GOWN DISPOSABLE) ×2 IMPLANT
GOWN STRL REUS W/TWL LRG LVL3 (GOWN DISPOSABLE) ×6
LENS IOL ACRSF VT TRC 315 25.0 IMPLANT
LENS IOL ACRYSOF VIVITY 25.0 ×3 IMPLANT
LENS IOL VIVITY 315 25.0 ×1 IMPLANT
MARKER SKIN DUAL TIP RULER LAB (MISCELLANEOUS) ×3 IMPLANT
NDL FILTER BLUNT 18X1 1/2 (NEEDLE) ×1 IMPLANT
NEEDLE FILTER BLUNT 18X 1/2SAF (NEEDLE) ×2
NEEDLE FILTER BLUNT 18X1 1/2 (NEEDLE) ×1 IMPLANT
PACK EYE AFTER SURG (MISCELLANEOUS) ×3 IMPLANT
PACK OPTHALMIC (MISCELLANEOUS) ×3 IMPLANT
PACK PORFILIO (MISCELLANEOUS) ×3 IMPLANT
SYR 3ML LL SCALE MARK (SYRINGE) ×3 IMPLANT
SYR TB 1ML LUER SLIP (SYRINGE) ×3 IMPLANT
WATER STERILE IRR 250ML POUR (IV SOLUTION) ×3 IMPLANT
WIPE NON LINTING 3.25X3.25 (MISCELLANEOUS) ×3 IMPLANT

## 2020-04-25 NOTE — Anesthesia Procedure Notes (Signed)
Procedure Name: MAC Performed by: Lachell Rochette, CRNA Pre-anesthesia Checklist: Patient identified, Emergency Drugs available, Suction available, Timeout performed and Patient being monitored Patient Re-evaluated:Patient Re-evaluated prior to induction Oxygen Delivery Method: Nasal cannula Placement Confirmation: positive ETCO2       

## 2020-04-25 NOTE — H&P (Signed)
[facility]   Primary Care Physician:  Galvin Proffer, MD Ophthalmologist: Dr. Druscilla Brownie  Pre-Procedure History & Physical: HPI:  Natasha Chan is a 79 y.o. female here for cataract surgery.   Past Medical History:  Diagnosis Date   Arthritis    left shoulder   Diabetes mellitus without complication (HCC)    Fibromyalgia    Hypertension    PONV (postoperative nausea and vomiting)    Pulmonary embolism (HCC)    Thyroid disease    Wears dentures    full upper    Past Surgical History:  Procedure Laterality Date   ABDOMINAL HYSTERECTOMY     ABDOMINAL SURGERY     APPENDECTOMY     BUNIONECTOMY     CATARACT EXTRACTION W/PHACO Left 04/11/2020   Procedure: CATARACT EXTRACTION PHACO AND INTRAOCULAR LENS PLACEMENT (IOC) LEFT DIABETIC VIVITY  TORIC LENS 10.19 00:57.6;  Surgeon: Galen Manila, MD;  Location: [facility];  Service: Ophthalmology;  Laterality: Left;  Diabetic - oral meds   CHOLECYSTECTOMY     COLON RESECTION     HERNIA REPAIR     KNEE SURGERY      Prior to Admission medications   Medication Sig Start Date End Date Taking? Authorizing Provider  allopurinol (ZYLOPRIM) 100 MG tablet Take by mouth. 08/21/17  Yes [provider]  busPIRone (BUSPAR) 10 MG tablet Take 10 mg by mouth 2 (two) times daily.   Yes [provider]  Cholecalciferol (VITAMIN D3) 1.25 MG (50000 UT) TABS Take by mouth once a week.   Yes [provider]  cyanocobalamin (,VITAMIN B-12,) 1000 MCG/ML injection Inject 1 mL into the muscle every 30 (thirty) days. 03/24/12  Yes [provider]  dicyclomine (BENTYL) 20 MG tablet Take 1 tablet (20 mg total) by mouth 2 (two) times daily. Patient taking differently: Take 20 mg by mouth as needed. 04/28/16  Yes Chan, Abigail, PA-C  DULoxetine (CYMBALTA) 60 MG capsule Take 120 mg by mouth daily.    Yes [provider]  furosemide (LASIX) 20 MG tablet Take 20 mg by mouth daily as  needed for fluid. 02/23/16  Yes [provider]  liraglutide (VICTOZA) 18 MG/3ML SOPN Inject into the skin.   Yes [provider]  Olmesartan-amLODIPine-HCTZ 20-5-12.5 MG TABS Take 1 tablet by mouth daily. 10/16/15  Yes [provider]  ondansetron (ZOFRAN) 4 MG tablet Take 1 tablet (4 mg total) by mouth every 8 (eight) hours as needed for nausea or vomiting. 05/10/16  Yes Regal, Kirstie Peri, DPM  potassium chloride (K-DUR,KLOR-CON) 10 MEQ tablet 20 mEq daily.  09/22/17  Yes [provider]  pravastatin (PRAVACHOL) 20 MG tablet Take by mouth. 11/05/16  Yes [provider]  QUEtiapine (SEROQUEL) 50 MG tablet Take 50 mg by mouth at bedtime.   Yes [provider]  sitaGLIPtin (JANUVIA) 50 MG tablet Take 50 mg by mouth daily. 10/12/15  Yes [provider]  thyroid (ARMOUR) 30 MG tablet Take 30 mg by mouth daily.   Yes [provider]    Allergies as of 04/13/2020 - Review Complete 04/11/2020  Allergen Reaction Noted   Ativan [lorazepam]  02/17/2012   Codeine Hives, Itching, and Nausea Only 02/17/2012   Sulfur Hives 02/17/2012    History reviewed. No pertinent family history.  Social History   Socioeconomic History   Marital status: Married    Spouse name: Not on file   Number of children: Not on file   Years of education: Not  on file   Highest education level: Not on file  Occupational History   Not on file  Tobacco Use   Smoking status: Never Smoker   Smokeless tobacco: Never Used  Vaping Use   Vaping Use: Never used  Substance and Sexual Activity   Alcohol use: Yes    Comment: occasional   Drug use: No   Sexual activity: Not on file  Other Topics Concern   Not on file  Social History Narrative   Not on file   Social Determinants of Health   Financial Resource Strain: Not on file  Food Insecurity: Not on file  Transportation Needs: Not on file  Physical Activity: Not on file  Stress: Not on  file  Social Connections: Not on file  Intimate Partner Violence: Not on file    Review of Systems: See HPI, otherwise negative ROS  Physical Exam: BP (!) 169/93    Pulse 89    Temp (!) 97 F (36.1 C) (Temporal)    Ht 5\' 5"  (1.651 m)    Wt 83.5 kg    SpO2 98%    BMI 30.62 kg/m  General:   Alert,  pleasant and cooperative in NAD Head:  Normocephalic and atraumatic. Respiratory:  Normal work of breathing. Heart:  Regular rate and rhythm.  Impression/Plan: Natasha Chan is here for cataract surgery.  Risks, benefits, limitations, and alternatives regarding cataract surgery have been reviewed with the patient.  Questions have been answered.  All parties agreeable.   [name], MD  04/25/2020, 9:30 AM

## 2020-04-25 NOTE — Anesthesia Preprocedure Evaluation (Signed)
Anesthesia Evaluation  Patient identified by MRN, date of birth, ID band  History of Anesthesia Complications (+) PONV and history of anesthetic complications  Airway Mallampati: II   Neck ROM: Full    Dental   Pulmonary    breath sounds clear to auscultation       Cardiovascular hypertension,  Rhythm:Regular Rate:Normal     Neuro/Psych    GI/Hepatic   Endo/Other  diabetes  Renal/GU      Musculoskeletal  (+) Arthritis , Fibromyalgia -  Abdominal   Peds  Hematology   Anesthesia Other Findings   Reproductive/Obstetrics                             Anesthesia Physical Anesthesia Plan  ASA: III  Anesthesia Plan: MAC   Post-op Pain Management:    Induction:   PONV Risk Score and Plan:   Airway Management Planned: Nasal Cannula  Additional Equipment:   Intra-op Plan:   Post-operative Plan:   Informed Consent: I have reviewed the patients History and Physical, chart, labs and discussed the procedure including the risks, benefits and alternatives for the proposed anesthesia with the patient or authorized representative who has indicated his/her understanding and acceptance.       Plan Discussed with:   Anesthesia Plan Comments:         Anesthesia Quick Evaluation

## 2020-04-25 NOTE — Anesthesia Postprocedure Evaluation (Signed)
Anesthesia Post Note  Patient: Natasha Chan  Procedure(s) Performed: CATARACT EXTRACTION PHACO AND INTRAOCULAR LENS PLACEMENT (IOC) RIGHT DIABETIC VIVITY TORIC 9.10 00:51.8 (Right Eye)     Patient location during evaluation: PACU Anesthesia Type: MAC Level of consciousness: awake and alert Pain management: pain level controlled Vital Signs Assessment: post-procedure vital signs reviewed and stable Respiratory status: spontaneous breathing, nonlabored ventilation and respiratory function stable Cardiovascular status: stable and blood pressure returned to baseline Postop Assessment: no apparent nausea or vomiting Anesthetic complications: no   No complications documented.  Wanda Plump Yedidya Duddy

## 2020-04-25 NOTE — Transfer of Care (Signed)
Immediate Anesthesia Transfer of Care Note  Patient: Natasha Chan  Procedure(s) Performed: CATARACT EXTRACTION PHACO AND INTRAOCULAR LENS PLACEMENT (IOC) RIGHT DIABETIC VIVITY TORIC 9.10 00:51.8 (Right Eye)  Patient Location: PACU  Anesthesia Type: MAC  Level of Consciousness: awake, alert  and patient cooperative  Airway and Oxygen Therapy: Patient Spontanous Breathing and Patient connected to supplemental oxygen  Post-op Assessment: Post-op Vital signs reviewed, Patient's Cardiovascular Status Stable, Respiratory Function Stable, Patent Airway and No signs of Nausea or vomiting  Post-op Vital Signs: Reviewed and stable  Complications: No complications documented.

## 2020-04-25 NOTE — Op Note (Signed)
PREOPERATIVE DIAGNOSIS:  Nuclear sclerotic cataract of the right eye.   POSTOPERATIVE DIAGNOSIS:  Nuclear sclerotic cataract of the right eye.   OPERATIVE PROCEDURE: Procedure(s): CATARACT EXTRACTION PHACO AND INTRAOCULAR LENS PLACEMENT (IOC) RIGHT DIABETIC VIVITY TORIC 9.10 00:51.8   SURGEON:  Galen Manila, MD.   ANESTHESIA: 1.      Managed anesthesia care. 2.     0.79ml of Shugarcaine was instilled following the paracentesis  Anesthesiologist: Jarome Matin, MD CRNA: Jimmy Picket, CRNA  COMPLICATIONS:  None.   TECHNIQUE:   Stop and chop    DESCRIPTION OF PROCEDURE:  The patient was examined and consented in the preoperative holding area where the aforementioned topical anesthesia was applied to the right eye.  The patient was brought back to the Operating Room where he was sat upright on the gurney and given a target to fixate upon while the eye was marked at the 3:00 and 9:00 position.  The patient was then reclined on the operating table.  The eye was prepped and draped in the usual sterile ophthalmic fashion and a lid speculum was placed. A paracentesis was created with the side port blade and the anterior chamber was filled with viscoelastic. A near clear corneal incision was performed with the steel keratome. A continuous curvilinear capsulorrhexis was performed with a cystotome followed by the capsulorrhexis forceps. Hydrodissection and hydrodelineation were carried out with BSS on a blunt cannula. The lens was removed in a stop and chop technique and the remaining cortical material was removed with the irrigation-aspiration handpiece. The eye was inflated with viscoelastic and the DIU  lens  was placed in the eye and rotated to within a few degrees of the predetermined orientation.  The remaining viscoelastic was removed from the eye.  The Sinskey hook was used to rotate the toric lens into its final resting place at 016 degrees.  0. The eye was inflated to a physiologic  pressure and found to be watertight. 0.70ml of Vigamox was placed in the anterior chamber.  The eye was dressed with Vigamox. The patient was given protective glasses to wear throughout the day and a shield with which to sleep tonight. The patient was also given drops with which to begin a drop regimen today and will follow-up with me in one day. Implant Name Type Inv. Item Serial No. Manufacturer Lot No. LRB No. Used Action  LENS IOL ACRYSOF VIVITY 25.0 - [id number]  LENS IOL ACRYSOF VIVITY 25.0 [id number] ALCON  Right 1 Implanted   Procedure(s) with comments: CATARACT EXTRACTION PHACO AND INTRAOCULAR LENS PLACEMENT (IOC) RIGHT DIABETIC VIVITY TORIC 9.10 00:51.8 (Right) - Diabetic - oral meds  Electronically signed: Galen Manila 04/25/2020 10:04 AM

## 2020-04-26 ENCOUNTER — Encounter: Payer: Self-pay | Admitting: Ophthalmology

## 2021-11-08 ENCOUNTER — Emergency Department (HOSPITAL_COMMUNITY): Payer: Medicare Other

## 2021-11-08 ENCOUNTER — Other Ambulatory Visit: Payer: Self-pay

## 2021-11-08 ENCOUNTER — Inpatient Hospital Stay (HOSPITAL_COMMUNITY)
Admission: EM | Admit: 2021-11-08 | Discharge: 2021-11-11 | DRG: 522 | Disposition: A | Discharge status: Home / Self Care | Payer: Medicare Other | Attending: Internal Medicine | Admitting: Internal Medicine

## 2021-11-08 ENCOUNTER — Encounter (HOSPITAL_COMMUNITY): Payer: Self-pay

## 2021-11-08 DIAGNOSIS — Z86711 Personal history of pulmonary embolism: Secondary | ICD-10-CM | POA: Diagnosis not present

## 2021-11-08 DIAGNOSIS — Z79890 Hormone replacement therapy (postmenopausal): Secondary | ICD-10-CM

## 2021-11-08 DIAGNOSIS — Z9841 Cataract extraction status, right eye: Secondary | ICD-10-CM

## 2021-11-08 DIAGNOSIS — Z9049 Acquired absence of other specified parts of digestive tract: Secondary | ICD-10-CM

## 2021-11-08 DIAGNOSIS — Z961 Presence of intraocular lens: Secondary | ICD-10-CM | POA: Diagnosis present

## 2021-11-08 DIAGNOSIS — A0472 Enterocolitis due to Clostridium difficile, not specified as recurrent: Secondary | ICD-10-CM | POA: Diagnosis not present

## 2021-11-08 DIAGNOSIS — M109 Gout, unspecified: Secondary | ICD-10-CM | POA: Diagnosis present

## 2021-11-08 DIAGNOSIS — E669 Obesity, unspecified: Secondary | ICD-10-CM | POA: Diagnosis present

## 2021-11-08 DIAGNOSIS — Z6830 Body mass index (BMI) 30.0-30.9, adult: Secondary | ICD-10-CM | POA: Diagnosis not present

## 2021-11-08 DIAGNOSIS — E785 Hyperlipidemia, unspecified: Secondary | ICD-10-CM | POA: Diagnosis present

## 2021-11-08 DIAGNOSIS — E119 Type 2 diabetes mellitus without complications: Secondary | ICD-10-CM | POA: Diagnosis present

## 2021-11-08 DIAGNOSIS — S72001A Fracture of unspecified part of neck of right femur, initial encounter for closed fracture: Secondary | ICD-10-CM | POA: Diagnosis present

## 2021-11-08 DIAGNOSIS — Z885 Allergy status to narcotic agent status: Secondary | ICD-10-CM | POA: Diagnosis not present

## 2021-11-08 DIAGNOSIS — W1830XA Fall on same level, unspecified, initial encounter: Secondary | ICD-10-CM | POA: Diagnosis present

## 2021-11-08 DIAGNOSIS — Z79899 Other long term (current) drug therapy: Secondary | ICD-10-CM | POA: Diagnosis not present

## 2021-11-08 DIAGNOSIS — I10 Essential (primary) hypertension: Secondary | ICD-10-CM | POA: Diagnosis present

## 2021-11-08 DIAGNOSIS — K219 Gastro-esophageal reflux disease without esophagitis: Secondary | ICD-10-CM | POA: Diagnosis present

## 2021-11-08 DIAGNOSIS — E079 Disorder of thyroid, unspecified: Secondary | ICD-10-CM | POA: Diagnosis present

## 2021-11-08 DIAGNOSIS — Z91048 Other nonmedicinal substance allergy status: Secondary | ICD-10-CM | POA: Diagnosis not present

## 2021-11-08 DIAGNOSIS — Z7984 Long term (current) use of oral hypoglycemic drugs: Secondary | ICD-10-CM | POA: Diagnosis not present

## 2021-11-08 DIAGNOSIS — Z9842 Cataract extraction status, left eye: Secondary | ICD-10-CM

## 2021-11-08 DIAGNOSIS — M797 Fibromyalgia: Secondary | ICD-10-CM | POA: Diagnosis present

## 2021-11-08 DIAGNOSIS — Z888 Allergy status to other drugs, medicaments and biological substances status: Secondary | ICD-10-CM | POA: Diagnosis not present

## 2021-11-08 DIAGNOSIS — Z20822 Contact with and (suspected) exposure to covid-19: Secondary | ICD-10-CM | POA: Diagnosis present

## 2021-11-08 LAB — BASIC METABOLIC PANEL
Anion gap: 10 (ref 5–15)
BUN: 22 mg/dL (ref 8–23)
CO2: 24 mmol/L (ref 22–32)
Calcium: 9.5 mg/dL (ref 8.9–10.3)
Chloride: 103 mmol/L (ref 98–111)
Creatinine, Ser: 1.68 mg/dL — ABNORMAL HIGH (ref 0.44–1.00)
GFR, Estimated: 31 mL/min — ABNORMAL LOW (ref >60)
Glucose, Bld: 136 mg/dL — ABNORMAL HIGH (ref 70–99)
Potassium: 4.5 mmol/L (ref 3.5–5.1)
Sodium: 137 mmol/L (ref 135–145)

## 2021-11-08 LAB — CBC WITH DIFFERENTIAL/PLATELET
Abs Immature Granulocytes: 0.32 10*3/uL — ABNORMAL HIGH (ref 0.00–0.07)
Basophils Absolute: 0 10*3/uL (ref 0.0–0.1)
Basophils Relative: 0 %
Eosinophils Absolute: 0 10*3/uL (ref 0.0–0.5)
Eosinophils Relative: 0 %
HCT: 36.1 % (ref 36.0–46.0)
Hemoglobin: 11.7 g/dL — ABNORMAL LOW (ref 12.0–15.0)
Immature Granulocytes: 2 %
Lymphocytes Relative: 9 %
Lymphs Abs: 1.3 10*3/uL (ref 0.7–4.0)
MCH: 28.1 pg (ref 26.0–34.0)
MCHC: 32.4 g/dL (ref 30.0–36.0)
MCV: 86.8 fL (ref 80.0–100.0)
Monocytes Absolute: 0.8 10*3/uL (ref 0.1–1.0)
Monocytes Relative: 5 %
Neutro Abs: 12.8 10*3/uL — ABNORMAL HIGH (ref 1.7–7.7)
Neutrophils Relative %: 84 %
Platelets: 774 10*3/uL — ABNORMAL HIGH (ref 150–400)
RBC: 4.16 MIL/uL (ref 3.87–5.11)
RDW: 15.8 % — ABNORMAL HIGH (ref 11.5–15.5)
WBC: 15.2 10*3/uL — ABNORMAL HIGH (ref 4.0–10.5)
nRBC: 0 % (ref 0.0–0.2)

## 2021-11-08 LAB — LIPID PANEL
Cholesterol: 133 mg/dL (ref 0–200)
HDL: 28 mg/dL — ABNORMAL LOW (ref >40)
LDL Cholesterol: 78 mg/dL (ref 0–99)
Total CHOL/HDL Ratio: 4.8 RATIO
Triglycerides: 134 mg/dL (ref <150)
VLDL: 27 mg/dL (ref 0–40)

## 2021-11-08 LAB — HEMOGLOBIN A1C
Hgb A1c MFr Bld: 6.4 % — ABNORMAL HIGH (ref 4.8–5.6)
Mean Plasma Glucose: 136.98 mg/dL

## 2021-11-08 LAB — CBC
HCT: 36.1 % (ref 36.0–46.0)
Hemoglobin: 11.6 g/dL — ABNORMAL LOW (ref 12.0–15.0)
MCH: 28 pg (ref 26.0–34.0)
MCHC: 32.1 g/dL (ref 30.0–36.0)
MCV: 87.2 fL (ref 80.0–100.0)
Platelets: 801 10*3/uL — ABNORMAL HIGH (ref 150–400)
RBC: 4.14 MIL/uL (ref 3.87–5.11)
RDW: 15.7 % — ABNORMAL HIGH (ref 11.5–15.5)
WBC: 23.6 10*3/uL — ABNORMAL HIGH (ref 4.0–10.5)
nRBC: 0 % (ref 0.0–0.2)

## 2021-11-08 LAB — SARS CORONAVIRUS 2 BY RT PCR: SARS Coronavirus 2 by RT PCR: NEGATIVE

## 2021-11-08 LAB — CREATININE, SERUM
Creatinine, Ser: 1.49 mg/dL — ABNORMAL HIGH (ref 0.44–1.00)
GFR, Estimated: 36 mL/min — ABNORMAL LOW (ref >60)

## 2021-11-08 MED ORDER — THYROID 30 MG PO TABS
30.0000 mg | ORAL_TABLET | Freq: Every day | ORAL | Status: DC
  Administered 2021-11-08 – 2021-11-11 (×4): 30 mg via ORAL
  Filled 2021-11-08 (×4): qty 1

## 2021-11-08 MED ORDER — MOMETASONE FURO-FORMOTEROL FUM 200-5 MCG/ACT IN AERO
2.0000 | INHALATION_SPRAY | Freq: Two times a day (BID) | RESPIRATORY_TRACT | Status: DC
  Administered 2021-11-09 – 2021-11-11 (×4): 2 via RESPIRATORY_TRACT
  Filled 2021-11-08: qty 8.8

## 2021-11-08 MED ORDER — ALLOPURINOL 100 MG PO TABS
100.0000 mg | ORAL_TABLET | Freq: Every day | ORAL | Status: DC
  Administered 2021-11-08 – 2021-11-11 (×4): 100 mg via ORAL
  Filled 2021-11-08 (×4): qty 1

## 2021-11-08 MED ORDER — HYDROMORPHONE HCL 1 MG/ML IJ SOLN
1.0000 mg | Freq: Once | INTRAMUSCULAR | Status: AC
  Administered 2021-11-08: 1 mg via INTRAVENOUS
  Filled 2021-11-08: qty 1

## 2021-11-08 MED ORDER — INSULIN ASPART 100 UNIT/ML IJ SOLN
0.0000 [IU] | INTRAMUSCULAR | Status: DC
  Administered 2021-11-09 (×2): 2 [IU] via SUBCUTANEOUS
  Administered 2021-11-09: 5 [IU] via SUBCUTANEOUS
  Administered 2021-11-09: 0 [IU] via SUBCUTANEOUS
  Administered 2021-11-10: 3 [IU] via SUBCUTANEOUS
  Administered 2021-11-10: 5 [IU] via SUBCUTANEOUS
  Administered 2021-11-10: 8 [IU] via SUBCUTANEOUS
  Administered 2021-11-10: 3 [IU] via SUBCUTANEOUS
  Administered 2021-11-10: 5 [IU] via SUBCUTANEOUS
  Administered 2021-11-10: 3 [IU] via SUBCUTANEOUS
  Administered 2021-11-11 (×3): 2 [IU] via SUBCUTANEOUS
  Filled 2021-11-08: qty 0.15

## 2021-11-08 MED ORDER — LACTATED RINGERS IV SOLN: INTRAVENOUS | Status: DC

## 2021-11-08 MED ORDER — DICYCLOMINE HCL 10 MG PO CAPS
10.0000 mg | ORAL_CAPSULE | Freq: Once | ORAL | Status: AC
  Administered 2021-11-08: 10 mg via ORAL
  Filled 2021-11-08: qty 1

## 2021-11-08 MED ORDER — ZOLPIDEM TARTRATE 5 MG PO TABS: 5.0000 mg | ORAL_TABLET | Freq: Every evening | ORAL | Status: DC | PRN

## 2021-11-08 MED ORDER — DULOXETINE HCL 60 MG PO CPEP
120.0000 mg | ORAL_CAPSULE | Freq: Every day | ORAL | Status: DC
  Administered 2021-11-09 – 2021-11-11 (×3): 120 mg via ORAL
  Filled 2021-11-08: qty 4
  Filled 2021-11-08 (×2): qty 2

## 2021-11-08 MED ORDER — METHOCARBAMOL 500 MG PO TABS
500.0000 mg | ORAL_TABLET | Freq: Four times a day (QID) | ORAL | Status: DC | PRN
  Administered 2021-11-09 – 2021-11-11 (×4): 500 mg via ORAL
  Filled 2021-11-08 (×4): qty 1

## 2021-11-08 MED ORDER — HEPARIN SODIUM (PORCINE) 5000 UNIT/ML IJ SOLN
5000.0000 [IU] | Freq: Three times a day (TID) | INTRAMUSCULAR | Status: DC
  Administered 2021-11-08 – 2021-11-09 (×2): 5000 [IU] via SUBCUTANEOUS
  Filled 2021-11-08 (×2): qty 1

## 2021-11-08 MED ORDER — HYDROCODONE-ACETAMINOPHEN 5-325 MG PO TABS
1.0000 | ORAL_TABLET | Freq: Four times a day (QID) | ORAL | Status: DC | PRN
  Administered 2021-11-08: 2 via ORAL
  Filled 2021-11-08: qty 2

## 2021-11-08 MED ORDER — BUSPIRONE HCL 10 MG PO TABS
10.0000 mg | ORAL_TABLET | Freq: Two times a day (BID) | ORAL | Status: DC
Start: 2021-11-08 — End: 2021-11-11
  Administered 2021-11-08 – 2021-11-11 (×6): 10 mg via ORAL
  Filled 2021-11-08 (×6): qty 1

## 2021-11-08 MED ORDER — QUETIAPINE FUMARATE 100 MG PO TABS
100.0000 mg | ORAL_TABLET | Freq: Every day | ORAL | Status: DC
  Administered 2021-11-08 – 2021-11-10 (×3): 100 mg via ORAL
  Filled 2021-11-08 (×3): qty 1

## 2021-11-08 MED ORDER — HYDROMORPHONE HCL 1 MG/ML IJ SOLN
0.5000 mg | INTRAMUSCULAR | Status: DC | PRN
  Administered 2021-11-08 – 2021-11-09 (×7): 0.5 mg via INTRAVENOUS
  Filled 2021-11-08 (×7): qty 1

## 2021-11-08 MED ORDER — METHOCARBAMOL 1000 MG/10ML IJ SOLN: 500.0000 mg | Freq: Four times a day (QID) | INTRAVENOUS | Status: DC | PRN

## 2021-11-08 MED ORDER — SODIUM CHLORIDE 0.9 % IV SOLN: INTRAVENOUS | Status: DC

## 2021-11-08 MED ORDER — BISACODYL 5 MG PO TBEC: 5.0000 mg | DELAYED_RELEASE_TABLET | Freq: Every day | ORAL | Status: DC | PRN

## 2021-11-08 MED ORDER — DICYCLOMINE HCL 10 MG/5ML PO SOLN
10.0000 mg | Freq: Once | ORAL | Status: DC
  Filled 2021-11-08: qty 5

## 2021-11-08 MED ORDER — ALUM & MAG HYDROXIDE-SIMETH 200-200-20 MG/5ML PO SUSP
30.0000 mL | Freq: Once | ORAL | Status: AC
  Administered 2021-11-08: 30 mL via ORAL
  Filled 2021-11-08: qty 30

## 2021-11-08 NOTE — ED Notes (Signed)
Pt pain meds administered and her o2 is starting to drop when she sleeps and snores. Will watch and if pt stays below 90 for more than a few moments will place pt on oxygen while she is sleeping

## 2021-11-08 NOTE — H&P (Signed)
[facility] History and Physical  Natasha Chan JOA:[id number] DOB: 30-Sep-1940 DOA: 11/08/2021  Referring physician:  PCP: Galvin Proffer, MD   Chief Complaint: RIGHT femoral Hip fracture  HPI: Natasha Chan  81 year old WF PMHx DM type II controlled without complication, HLD, essential HTN, Thyroid disease, Fibromyalgia, Gout  Here with mechanical fall just prior to arrival.  Simpson onto her RIGHT hip.  Did not strike her head and no loss of consciousness.  No chest or abdominal discomfort.  Complains of sharp pain to her right hip that is worse with movement.  She cannot ambulate.  Denies any distal numbness or tingling to her right foot.     Review of Systems:  Covid vaccination;  Constitutional:  No weight loss, night sweats, Fevers, chills, fatigue.  HEENT:  No headaches, Difficulty swallowing,Tooth/dental problems,Sore throat,  No sneezing, itching, ear ache, nasal congestion, post nasal drip,  Cardio-vascular:  No chest pain, Orthopnea, PND, swelling in lower extremities, anasarca, dizziness, palpitations  GI:  No heartburn, indigestion, abdominal pain, nausea, vomiting, diarrhea, change in bowel habits, loss of appetite  Resp:  No shortness of breath with exertion or at rest. No excess mucus, no productive cough, No non-productive cough, No coughing up of blood.No change in color of mucus.No wheezing.No chest wall deformity  Skin:  no rash or lesions.  GU:  no dysuria, change in color of urine, no urgency or frequency. No flank pain.  Musculoskeletal:  No joint pain or swelling. No decreased range of motion. No back pain.  Psych:  No change in mood or affect. No depression or anxiety. No memory loss.   Past Medical History:  Diagnosis Date   Arthritis    left shoulder   Diabetes mellitus without complication (HCC)    Fibromyalgia    Hypertension    PONV (postoperative nausea and vomiting)    Pulmonary embolism (HCC)    Thyroid disease    Wears  dentures    full upper   Past Surgical History:  Procedure Laterality Date   ABDOMINAL HYSTERECTOMY     ABDOMINAL SURGERY     APPENDECTOMY     BUNIONECTOMY     CATARACT EXTRACTION W/PHACO Left 04/11/2020   Procedure: CATARACT EXTRACTION PHACO AND INTRAOCULAR LENS PLACEMENT (IOC) LEFT DIABETIC VIVITY  TORIC LENS 10.19 00:57.6;  Surgeon: Galen Manila, MD;  Location: [facility];  Service: Ophthalmology;  Laterality: Left;  Diabetic - oral meds   CATARACT EXTRACTION W/PHACO Right 04/25/2020   Procedure: CATARACT EXTRACTION PHACO AND INTRAOCULAR LENS PLACEMENT (IOC) RIGHT DIABETIC VIVITY TORIC 9.10 00:51.8;  Surgeon: Galen Manila, MD;  Location: [facility];  Service: Ophthalmology;  Laterality: Right;  Diabetic - oral meds   CHOLECYSTECTOMY     COLON RESECTION     HERNIA REPAIR     KNEE SURGERY     Social History:  reports that she has never smoked. She has never used smokeless tobacco. She reports current alcohol use. She reports that she does not use drugs.  Allergies  Allergen Reactions   Ativan [Lorazepam]     hyper   Codeine Hives, Itching and Nausea Only   Elemental Sulfur Hives    FHx.  Negative cancer, negative HTN, negative diabetes, negative thyroid disease.  Prior to Admission medications   Medication Sig Start Date End Date Taking? Authorizing Provider  allopurinol (ZYLOPRIM) 100 MG tablet Take by mouth. 08/21/17   [provider]  busPIRone (BUSPAR) 10 MG tablet Take 10 mg by mouth 2 (  two) times daily.    [provider]  Cholecalciferol (VITAMIN D3) 1.25 MG (50000 UT) TABS Take by mouth once a week.    [provider]  cyanocobalamin (,VITAMIN B-12,) 1000 MCG/ML injection Inject 1 mL into the muscle every 30 (thirty) days. 03/24/12   [provider]  dicyclomine (BENTYL) 20 MG tablet Take 1 tablet (20 mg total) by mouth 2 (two) times daily. Patient taking differently: Take 20 mg by mouth as needed. 04/28/16    Arthor Captain, PA-C  DULoxetine (CYMBALTA) 60 MG capsule Take 120 mg by mouth daily.     [provider]  furosemide (LASIX) 20 MG tablet Take 20 mg by mouth daily as needed for fluid. 02/23/16   [provider]  liraglutide (VICTOZA) 18 MG/3ML SOPN Inject into the skin.    [provider]  Olmesartan-amLODIPine-HCTZ 20-5-12.5 MG TABS Take 1 tablet by mouth daily. 10/16/15   [provider]  ondansetron (ZOFRAN) 4 MG tablet Take 1 tablet (4 mg total) by mouth every 8 (eight) hours as needed for nausea or vomiting. 05/10/16   Lenn Sink, DPM  potassium chloride (K-DUR,KLOR-CON) 10 MEQ tablet 20 mEq daily.  09/22/17   [provider]  pravastatin (PRAVACHOL) 20 MG tablet Take by mouth. 11/05/16   [provider]  QUEtiapine (SEROQUEL) 50 MG tablet Take 50 mg by mouth at bedtime.    [provider]  sitaGLIPtin (JANUVIA) 50 MG tablet Take 50 mg by mouth daily. 10/12/15   [provider]  thyroid (ARMOUR) 30 MG tablet Take 30 mg by mouth daily.    [provider]     Consultants:  Dr. Gean Birchwood orthopedic surgery   Procedures/Significant Events:  7/6 DG hip unilateral - Displaced RIGHT femoral neck fracture    I have personally reviewed and interpreted all radiology studies and my findings are as above.   VENTILATOR SETTINGS:    Cultures 7/6 SARS coronavirus negative   Antimicrobials:    Devices    LINES / TUBES:      Continuous Infusions:  lactated ringers 125 mL/hr at 11/08/21 1348    Physical Exam: Vitals:   11/08/21 1326 11/08/21 1430 11/08/21 1600  BP: 117/78 129/84 131/79  Pulse: 99 100 (!) 106  Resp: 18 19 19   Temp: 97.8 F (36.6 C)    TempSrc: Oral    SpO2: 98% 98% 95%  Weight: 83.5 kg    Height: 5\' 5"  (1.651 m)      Wt Readings from Last 3 Encounters:  11/08/21 83.5 kg  04/25/20 83.5 kg  04/11/20 83.5 kg    General: A/O x4, No acute respiratory distress Eyes:  negative scleral hemorrhage, negative anisocoria, negative icterus ENT: Negative Runny nose, negative gingival bleeding, Neck:  Negative scars, masses, torticollis, lymphadenopathy, JVD Lungs: Clear to auscultation bilaterally without wheezes or crackles Cardiovascular: Regular rate and rhythm without murmur gallop or rub normal S1 and S2 Abdomen: OBESE, positive abdominal pain (GERD), nondistended, positive soft, bowel sounds, no rebound, no ascites, no appreciable mass Extremities: No significant cyanosis, clubbing, or edema bilateral lower extremities Skin: Negative rashes, lesions, ulcers Psychiatric:  Negative depression, negative anxiety, negative fatigue, negative mania, RIGHT hip pain Central nervous system:  Cranial nerves II through XII intact, tongue/uvula midline, all extremities muscle strength 5/5, sensation intact throughout, negative dysarthria, negative expressive aphasia, negative receptive aphasia.        Labs on Admission:  Basic Metabolic Panel: Recent Labs  Lab 11/08/21 1339  NA  137  K 4.5  CL 103  CO2 24  GLUCOSE 136*  BUN 22  CREATININE 1.68*  CALCIUM 9.5   Liver Function Tests: No results for input(s): "AST", "ALT", "ALKPHOS", "BILITOT", "PROT", "ALBUMIN" in the last 168 hours. No results for input(s): "LIPASE", "AMYLASE" in the last 168 hours. No results for input(s): "AMMONIA" in the last 168 hours. CBC: Recent Labs  Lab 11/08/21 1339  WBC 15.2*  NEUTROABS 12.8*  HGB 11.7*  HCT 36.1  MCV 86.8  PLT 774*   Cardiac Enzymes: No results for input(s): "CKTOTAL", "CKMB", "CKMBINDEX", "TROPONINI" in the last 168 hours.  BNP (last 3 results) No results for input(s): "BNP" in the last 8760 hours.  ProBNP (last 3 results) No results for input(s): "PROBNP" in the last 8760 hours.  CBG: No results for input(s): "GLUCAP" in the last 168 hours.  Radiological Exams on Admission: DG Chest 1 View  Result Date: 11/08/2021 CLINICAL DATA:  Fall, right hip  pain EXAM: CHEST  1 VIEW COMPARISON:  08/08/2016 FINDINGS: Low lung volumes are present, causing crowding of the pulmonary vasculature. Cardiac and mediastinal margins appear normal. Linear subsegmental atelectasis or scarring along the left hemidiaphragm. No blunting of the costophrenic angles. IMPRESSION: 1. Linear subsegmental atelectasis or scarring at the left lung base. 2. Low lung volumes are present, causing crowding of the pulmonary vasculature. Electronically Signed   By: Gaylyn Rong M.D.   On: 11/08/2021 14:32   DG Hip Unilat W or Wo Pelvis 2-3 Views Right  Result Date: 11/08/2021 CLINICAL DATA:  Larey Seat.  Right hip pain. EXAM: DG HIP (WITH OR WITHOUT PELVIS) 2-3V RIGHT COMPARISON:  None Available. FINDINGS: There is a displaced right femoral neck fracture. The left hip is intact. The pubic symphysis and SI joints are intact. No pelvic fractures. IMPRESSION: Displaced right femoral neck fracture. Electronically Signed   By: Rudie Meyer M.D.   On: 11/08/2021 14:26    EKG: Independently reviewed.   Assessment/Plan Principal Problem:   Displaced fracture of right femoral neck (HCC) Active Problems:   Diabetes mellitus without complication (HCC)   Hypertension   Thyroid disease   Fibromyalgia   Gout  Displaced fracture RIGHT femoral neck - Per Dr. Gean Birchwood [facility] reserved OR time for tomorrow at 1:00 for anterior right hip replacement  DM type II without complications -Moderate SSI  Essential HTN - Hold home medication.  Stable  Thyroid disease - Home medication  Fibromyalgia - Home medication  Gout - Home medication  Obesity (BMI 30.63 kg/m.)  GERD - GI cocktail PRN      Mobility Assessment (last 72 hours)     Mobility Assessment   No documentation.           Code Status: Full (DVT Prophylaxis: Family Communication: 7/6 husband and daughter at bedside for discussion of plan of care all questions answered Status is:  Inpatient    Dispo: The patient is from: Home              Anticipated d/c is to: Home              Anticipated d/c date is: 3 days              Patient currently is not medically stable to d/c.     Data Reviewed: Care during the described time interval was provided by me .  I have reviewed this patient's available data, including medical history, events of note, physical examination, and all test results  as part of my evaluation.   The patient is critically ill with multiple organ systems failure and requires high complexity decision making for assessment and support, frequent evaluation and titration of therapies, application of advanced monitoring technologies and extensive interpretation of multiple databases. Critical Care Time devoted to patient care services described in this note  Time spent: 70 minutes   Merlene Dante J [facility]

## 2021-11-08 NOTE — ED Triage Notes (Signed)
Pt BIB EMS from home after mechanical fall. Pt reports right hip pain.  Fentanyl en route Cbg 144 110/50 bp 100 hr  97% room air

## 2021-11-08 NOTE — H&P (View-Only) (Signed)
Reason for Consult: Displaced right femoral neck fracture Referring Physician: Lacretia Leigh  Natasha Chan is an 81 y.o. female.  HPI: Patient was at home taking care of her grandchild around 87 this morning was lifting the child into a crib slipped and fell onto her right side sustaining a displaced right femoral neck fracture.  EMS was called she was transported to [facility] emergency room examined and radiographs were taken showing the isolated injury to her right hip.  She denies any loss of consciousness denies any injury to her upper extremities or her left lower extremity.  She denies any loss of sensation to her right lower extremity.  She does have very significant pain in her right groin lateral hip and buttock.  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  Past Medical History:  Diagnosis Date   Arthritis    left shoulder   Diabetes mellitus without complication (HCC)    Fibromyalgia    Hypertension    PONV (postoperative nausea and vomiting)    Pulmonary embolism (HCC)    Thyroid disease    Wears dentures    full upper    Past Surgical History:  Procedure Laterality Date   ABDOMINAL HYSTERECTOMY     ABDOMINAL SURGERY     APPENDECTOMY     BUNIONECTOMY     CATARACT EXTRACTION W/PHACO Left 04/11/2020   Procedure: CATARACT EXTRACTION PHACO AND INTRAOCULAR LENS PLACEMENT ([place]) LEFT DIABETIC VIVITY  TORIC LENS 10.19 00:57.6;  Surgeon: Birder Robson, MD;  Location: [place];  Service: Ophthalmology;  Laterality: Left;  Diabetic - oral meds   CATARACT EXTRACTION W/PHACO Right 04/25/2020   Procedure: CATARACT EXTRACTION PHACO AND INTRAOCULAR LENS PLACEMENT ([facility]) RIGHT DIABETIC VIVITY TORIC 9.10 00:51.8;  Surgeon: Birder Robson, MD;  Location: [place];  Service: Ophthalmology;  Laterality: Right;  Diabetic - oral meds   CHOLECYSTECTOMY     COLON  RESECTION     HERNIA REPAIR     KNEE SURGERY      History reviewed. No pertinent family history.  Social History:  reports that she has never smoked. She has never used smokeless tobacco. She reports current alcohol use. She reports that she does not use drugs.  Allergies:  Allergies  Allergen Reactions   Ativan [Lorazepam]     hyper   Codeine Hives, Itching and Nausea Only   Elemental Sulfur Hives    Medications: I have reviewed the patient's current medications.  Results for orders placed or performed during the hospital encounter of 11/08/21 (from the past 48 hour(s))  CBC with Differential/Platelet     Status: Abnormal   Collection Time: 11/08/21  1:39 PM  Result Value Ref Range   WBC 15.2 (H) 4.0 - 10.5 K/uL   RBC 4.16 3.87 - 5.11 MIL/uL   Hemoglobin 11.7 (L) 12.0 - 15.0 g/dL   HCT 36.1 36.0 - 46.0 %   MCV 86.8 80.0 - 100.0 fL   MCH 28.1 26.0 - 34.0 pg   MCHC 32.4 30.0 - 36.0 g/dL   RDW 15.8 (H) 11.5 - 15.5 %   Platelets 774 (H) 150 - 400 K/uL   nRBC 0.0 0.0 - 0.2 %   Neutrophils Relative % 84 %   Neutro Abs 12.8 (H) 1.7 - 7.7 K/uL   Lymphocytes Relative 9 %   Lymphs Abs 1.3 0.7 - 4.0 K/uL   Monocytes  Relative 5 %   Monocytes Absolute 0.8 0.1 - 1.0 K/uL   Eosinophils Relative 0 %   Eosinophils Absolute 0.0 0.0 - 0.5 K/uL   Basophils Relative 0 %   Basophils Absolute 0.0 0.0 - 0.1 K/uL   Immature Granulocytes 2 %   Abs Immature Granulocytes 0.32 (H) 0.00 - 0.07 K/uL    Comment: Performed at [facility], [address] [place], [place] [address]  Basic metabolic panel     Status: Abnormal   Collection Time: 11/08/21  1:39 PM  Result Value Ref Range   Sodium 137 135 - 145 mmol/L   Potassium 4.5 3.5 - 5.1 mmol/L   Chloride 103 98 - 111 mmol/L   CO2 24 22 - 32 mmol/L   Glucose, Bld 136 (H) 70 - 99 mg/dL    Comment: Glucose reference range applies only to samples taken after fasting for at least 8 hours.   BUN 22 8 - 23 mg/dL   Creatinine,  Ser 8.10 (H) 0.44 - 1.00 mg/dL   Calcium 9.5 8.9 - 17.5 mg/dL   GFR, Estimated 31 (L) >60 mL/min    Comment: (NOTE) Calculated using the CKD-EPI Creatinine Equation (2021)    Anion gap 10 5 - 15    Comment: Performed at [facility], [address], [place], [place] [address]    DG Chest 1 View  Result Date: 11/08/2021 CLINICAL DATA:  Fall, right hip pain EXAM: CHEST  1 VIEW COMPARISON:  08/08/2016 FINDINGS: Low lung volumes are present, causing crowding of the pulmonary vasculature. Cardiac and mediastinal margins appear normal. Linear subsegmental atelectasis or scarring along the left hemidiaphragm. No blunting of the costophrenic angles. IMPRESSION: 1. Linear subsegmental atelectasis or scarring at the left lung base. 2. Low lung volumes are present, causing crowding of the pulmonary vasculature. Electronically Signed   By: Gaylyn Rong M.D.   On: 11/08/2021 14:32   DG Hip Unilat W or Wo Pelvis 2-3 Views Right  Result Date: 11/08/2021 CLINICAL DATA:  Larey Seat.  Right hip pain. EXAM: DG HIP (WITH OR WITHOUT PELVIS) 2-3V RIGHT COMPARISON:  None Available. FINDINGS: There is a displaced right femoral neck fracture. The left hip is intact. The pubic symphysis and SI joints are intact. No pelvic fractures. IMPRESSION: Displaced right femoral neck fracture. Electronically Signed   By: Rudie Meyer M.D.   On: 11/08/2021 14:26    Review of Systems:  Patient denies any shortness of breath denies any chest pains and again denies any discomfort to her upper extremities or her left lower extremity. Blood pressure 131/79, pulse (!) 106, temperature 97.8 F (36.6 C), temperature source Oral, resp. rate 19, height 5\' 5"  (1.651 m), weight 83.5 kg, SpO2 95 %. Physical Exam Right lower extremity is held flexed externally rotated position and is about an inch shorter than the left lower extremity.  Exquisitely tender along the right groin lateral hip and buttock.  She can extend her knee  fully and flex to 90 degrees full range of motion of her ankle normal pulses to both feet normal sensation of both feet. Assessment/Plan: Displaced right femoral neck fracture in a 81 year old woman whose comorbidities include hypertension and diabetes well controlled.  I reserved our time for tomorrow at 1:00 for anterior right hip replacement.  I did review the radiographs and findings with the patient and her family.  We went over the risks and benefits of the procedure.  All questions were encouraged and answered.  70  Tawni Carnes 11/08/2021, 4:35 PM

## 2021-11-08 NOTE — Progress Notes (Signed)
Orthopedic Tech Progress Note Patient Details:  Natasha Chan [birth year] [id number]  Musculoskeletal Traction Type of Traction: [place] Skin Traction Traction Location: Right leg Traction Weight: 5 lbs   Post Interventions Patient Tolerated: Well  Natasha Chan 11/08/2021, 5:32 PM

## 2021-11-08 NOTE — Consult Note (Signed)
Reason for Consult: Displaced right femoral neck fracture Referring Physician: Anthony Allen  Natasha Chan is an 80 y.o. female.  HPI: Patient was at home taking care of her grandchild around 1030 this morning was lifting the child into a crib slipped and fell onto her right side sustaining a displaced right femoral neck fracture.  EMS was called she was transported to [facility] emergency room examined and radiographs were taken showing the isolated injury to her right hip.  She denies any loss of consciousness denies any injury to her upper extremities or her left lower extremity.  She denies any loss of sensation to her right lower extremity.  She does have very significant pain in her right groin lateral hip and buttock.  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  Past Medical History:  Diagnosis Date   Arthritis    left shoulder   Diabetes mellitus without complication (HCC)    Fibromyalgia    Hypertension    PONV (postoperative nausea and vomiting)    Pulmonary embolism (HCC)    Thyroid disease    Wears dentures    full upper    Past Surgical History:  Procedure Laterality Date   ABDOMINAL HYSTERECTOMY     ABDOMINAL SURGERY     APPENDECTOMY     BUNIONECTOMY     CATARACT EXTRACTION W/PHACO Left 04/11/2020   Procedure: CATARACT EXTRACTION PHACO AND INTRAOCULAR LENS PLACEMENT ([facility]) LEFT DIABETIC VIVITY  TORIC LENS 10.19 00:57.6;  Surgeon: Porfilio, William, MD;  Location: [facility];  Service: Ophthalmology;  Laterality: Left;  Diabetic - oral meds   CATARACT EXTRACTION W/PHACO Right 04/25/2020   Procedure: CATARACT EXTRACTION PHACO AND INTRAOCULAR LENS PLACEMENT ([facility]) RIGHT DIABETIC VIVITY TORIC 9.10 00:51.8;  Surgeon: Porfilio, William, MD;  Location: [facility];  Service: Ophthalmology;  Laterality: Right;  Diabetic - oral meds   CHOLECYSTECTOMY     COLON  RESECTION     HERNIA REPAIR     KNEE SURGERY      History reviewed. No pertinent family history.  Social History:  reports that she has never smoked. She has never used smokeless tobacco. She reports current alcohol use. She reports that she does not use drugs.  Allergies:  Allergies  Allergen Reactions   Ativan [Lorazepam]     hyper   Codeine Hives, Itching and Nausea Only   Elemental Sulfur Hives    Medications: I have reviewed the patient's current medications.  Results for orders placed or performed during the hospital encounter of 11/08/21 (from the past 48 hour(s))  CBC with Differential/Platelet     Status: Abnormal   Collection Time: 11/08/21  1:39 PM  Result Value Ref Range   WBC 15.2 (H) 4.0 - 10.5 K/uL   RBC 4.16 3.87 - 5.11 MIL/uL   Hemoglobin 11.7 (L) 12.0 - 15.0 g/dL   HCT 36.1 36.0 - 46.0 %   MCV 86.8 80.0 - 100.0 fL   MCH 28.1 26.0 - 34.0 pg   MCHC 32.4 30.0 - 36.0 g/dL   RDW 15.8 (H) 11.5 - 15.5 %   Platelets 774 (H) 150 - 400 K/uL   nRBC 0.0 0.0 - 0.2 %   Neutrophils Relative % 84 %   Neutro Abs 12.8 (H) 1.7 - 7.7 K/uL   Lymphocytes Relative 9 %   Lymphs Abs 1.3 0.7 - 4.0 K/uL   Monocytes   Relative 5 %   Monocytes Absolute 0.8 0.1 - 1.0 K/uL   Eosinophils Relative 0 %   Eosinophils Absolute 0.0 0.0 - 0.5 K/uL   Basophils Relative 0 %   Basophils Absolute 0.0 0.0 - 0.1 K/uL   Immature Granulocytes 2 %   Abs Immature Granulocytes 0.32 (H) 0.00 - 0.07 K/uL    Comment: Performed at [facility], [address] [place], [place] [address]  Basic metabolic panel     Status: Abnormal   Collection Time: 11/08/21  1:39 PM  Result Value Ref Range   Sodium 137 135 - 145 mmol/L   Potassium 4.5 3.5 - 5.1 mmol/L   Chloride 103 98 - 111 mmol/L   CO2 24 22 - 32 mmol/L   Glucose, Bld 136 (H) 70 - 99 mg/dL    Comment: Glucose reference range applies only to samples taken after fasting for at least 8 hours.   BUN 22 8 - 23 mg/dL   Creatinine,  Ser 8.10 (H) 0.44 - 1.00 mg/dL   Calcium 9.5 8.9 - 17.5 mg/dL   GFR, Estimated 31 (L) >60 mL/min    Comment: (NOTE) Calculated using the CKD-EPI Creatinine Equation (2021)    Anion gap 10 5 - 15    Comment: Performed at [facility], [address], [place], [place] [address]    DG Chest 1 View  Result Date: 11/08/2021 CLINICAL DATA:  Fall, right hip pain EXAM: CHEST  1 VIEW COMPARISON:  08/08/2016 FINDINGS: Low lung volumes are present, causing crowding of the pulmonary vasculature. Cardiac and mediastinal margins appear normal. Linear subsegmental atelectasis or scarring along the left hemidiaphragm. No blunting of the costophrenic angles. IMPRESSION: 1. Linear subsegmental atelectasis or scarring at the left lung base. 2. Low lung volumes are present, causing crowding of the pulmonary vasculature. Electronically Signed   By: Gaylyn Rong M.D.   On: 11/08/2021 14:32   DG Hip Unilat W or Wo Pelvis 2-3 Views Right  Result Date: 11/08/2021 CLINICAL DATA:  Larey Seat.  Right hip pain. EXAM: DG HIP (WITH OR WITHOUT PELVIS) 2-3V RIGHT COMPARISON:  None Available. FINDINGS: There is a displaced right femoral neck fracture. The left hip is intact. The pubic symphysis and SI joints are intact. No pelvic fractures. IMPRESSION: Displaced right femoral neck fracture. Electronically Signed   By: Rudie Meyer M.D.   On: 11/08/2021 14:26    Review of Systems:  Patient denies any shortness of breath denies any chest pains and again denies any discomfort to her upper extremities or her left lower extremity. Blood pressure 131/79, pulse (!) 106, temperature 97.8 F (36.6 C), temperature source Oral, resp. rate 19, height 5\' 5"  (1.651 m), weight 83.5 kg, SpO2 95 %. Physical Exam Right lower extremity is held flexed externally rotated position and is about an inch shorter than the left lower extremity.  Exquisitely tender along the right groin lateral hip and buttock.  She can extend her knee  fully and flex to 90 degrees full range of motion of her ankle normal pulses to both feet normal sensation of both feet. Assessment/Plan: Displaced right femoral neck fracture in a 81 year old woman whose comorbidities include hypertension and diabetes well controlled.  I reserved our time for tomorrow at 1:00 for anterior right hip replacement.  I did review the radiographs and findings with the patient and her family.  We went over the risks and benefits of the procedure.  All questions were encouraged and answered.  70  Tawni Carnes 11/08/2021, 4:35 PM

## 2021-11-08 NOTE — ED Notes (Signed)
Pt placed on 2lpm via [place] during sleep

## 2021-11-08 NOTE — ED Provider Notes (Signed)
[facility]-EMERGENCY DEPT Provider Note   CSN: [id number] Arrival date & time: 11/08/21  1311     History  Chief Complaint  Patient presents with   Natasha Chan    SHAKEILA PFARR is a 81 y.o. female.  81 year old female here with mechanical fall just prior to arrival.  [place] onto her left hip.  Did not strike her head and no loss of consciousness.  No chest or abdominal discomfort.  Complains of sharp pain to her right hip that is worse with movement.  She cannot ambulate.  Denies any distal numbness or tingling to her right foot.      Home Medications Prior to Admission medications   Medication Sig Start Date End Date Taking? Authorizing Provider  allopurinol (ZYLOPRIM) 100 MG tablet Take by mouth. 08/21/17   [provider]  busPIRone (BUSPAR) 10 MG tablet Take 10 mg by mouth 2 (two) times daily.    [provider]  Cholecalciferol (VITAMIN D3) 1.25 MG (50000 UT) TABS Take by mouth once a week.    [provider]  cyanocobalamin (,VITAMIN B-12,) 1000 MCG/ML injection Inject 1 mL into the muscle every 30 (thirty) days. 03/24/12   [provider]  dicyclomine (BENTYL) 20 MG tablet Take 1 tablet (20 mg total) by mouth 2 (two) times daily. Patient taking differently: Take 20 mg by mouth as needed. 04/28/16   Arthor Captain, PA-C  DULoxetine (CYMBALTA) 60 MG capsule Take 120 mg by mouth daily.     [provider]  furosemide (LASIX) 20 MG tablet Take 20 mg by mouth daily as needed for fluid. 02/23/16   [provider]  liraglutide (VICTOZA) 18 MG/3ML SOPN Inject into the skin.    [provider]  Olmesartan-amLODIPine-HCTZ 20-5-12.5 MG TABS Take 1 tablet by mouth daily. 10/16/15   [provider]  ondansetron (ZOFRAN) 4 MG tablet Take 1 tablet (4 mg total) by mouth every 8 (eight) hours as needed for nausea or vomiting. 05/10/16   Lenn Sink, DPM  potassium chloride (K-DUR,KLOR-CON) 10 MEQ tablet  20 mEq daily.  09/22/17   [provider]  pravastatin (PRAVACHOL) 20 MG tablet Take by mouth. 11/05/16   [provider]  QUEtiapine (SEROQUEL) 50 MG tablet Take 50 mg by mouth at bedtime.    [provider]  sitaGLIPtin (JANUVIA) 50 MG tablet Take 50 mg by mouth daily. 10/12/15   [provider]  thyroid (ARMOUR) 30 MG tablet Take 30 mg by mouth daily.    [provider]      Allergies    Ativan [lorazepam], Codeine, and Elemental sulfur    Review of Systems   Review of Systems  All other systems reviewed and are negative.   Physical Exam Updated Vital Signs BP 117/78 (BP Location: Right Arm)   Pulse 99   Temp 97.8 F (36.6 C) (Oral)   Resp 18   Ht 1.651 m (5\' 5" )   Wt 83.5 kg   SpO2 98%   BMI 30.63 kg/m  Physical Exam Vitals and nursing note reviewed.  Constitutional:      General: She is not in acute distress.    Appearance: Normal appearance. She is well-developed. She is not toxic-appearing.  HENT:     Head: Normocephalic and atraumatic.  Eyes:     General: Lids are normal.     Conjunctiva/sclera: Conjunctivae normal.     Pupils: Pupils are equal, round, and reactive to light.  Neck:  Thyroid: No thyroid mass.     Trachea: No tracheal deviation.  Cardiovascular:     Rate and Rhythm: Normal rate and regular rhythm.     Heart sounds: Normal heart sounds. No murmur heard.    No gallop.  Pulmonary:     Effort: Pulmonary effort is normal. No respiratory distress.     Breath sounds: Normal breath sounds. No stridor. No decreased breath sounds, wheezing, rhonchi or rales.  Abdominal:     General: There is no distension.     Palpations: Abdomen is soft.     Tenderness: There is no abdominal tenderness. There is no rebound.  Musculoskeletal:        General: No tenderness. Normal range of motion.     Cervical back: Normal range of motion and neck supple.       Legs:     Comments: Right leg is shortened and rotated.   Neurovascular intact right foot  Skin:    General: Skin is warm and dry.     Findings: No abrasion or rash.  Neurological:     Mental Status: She is alert and oriented to person, place, and time. Mental status is at baseline.     GCS: GCS eye subscore is 4. GCS verbal subscore is 5. GCS motor subscore is 6.     Cranial Nerves: No cranial nerve deficit.     Sensory: No sensory deficit.     Motor: Motor function is intact.  Psychiatric:        Attention and Perception: Attention normal.        Speech: Speech normal.        Behavior: Behavior normal.    ED Results / Procedures / Treatments   Labs (all labs ordered are listed, but only abnormal results are displayed) Labs Reviewed  CBC WITH DIFFERENTIAL/PLATELET  BASIC METABOLIC PANEL    EKG None  Radiology No results found.  Procedures Procedures    Medications Ordered in ED Medications  lactated ringers infusion (has no administration in time range)  HYDROmorphone (DILAUDID) injection 1 mg (has no administration in time range)    ED Course/ Medical Decision Making/ A&P                           Medical Decision Making Amount and/or Complexity of Data Reviewed Labs: ordered. Radiology: ordered. ECG/medicine tests: ordered.  Risk Prescription drug management.   Patient medicated for pain with Dilaudid here.  Patient's right hip and pelvis x-ray per my interpretation shows right femoral neck fracture.  Discussed with patient's orthopedist, Dr. Turner Daniels, who states that patient can stay here at [facility] he will try to do her surgery tomorrow.  Will consult hospitalist for admission        Final Clinical Impression(s) / ED Diagnoses Final diagnoses:  None    Rx / DC Orders ED Discharge Orders     None         Lorre Nick, MD 11/08/21 1557

## 2021-11-08 NOTE — ED Notes (Signed)
Rounded on pt and she advised the Dilaudid helped the best out of everything and that the maylox we gave her also helped with her stomach pain and gas feeling

## 2021-11-09 ENCOUNTER — Encounter (HOSPITAL_COMMUNITY)
Admission: EM | Disposition: A | Discharge status: Home / Self Care | Payer: Self-pay | Source: Home / Self Care | Attending: Internal Medicine

## 2021-11-09 ENCOUNTER — Inpatient Hospital Stay (HOSPITAL_COMMUNITY): Payer: Medicare Other | Admitting: Anesthesiology

## 2021-11-09 ENCOUNTER — Inpatient Hospital Stay (HOSPITAL_COMMUNITY): Payer: Medicare Other

## 2021-11-09 ENCOUNTER — Other Ambulatory Visit: Payer: Self-pay

## 2021-11-09 ENCOUNTER — Encounter (HOSPITAL_COMMUNITY): Payer: Self-pay | Admitting: Internal Medicine

## 2021-11-09 DIAGNOSIS — S72001A Fracture of unspecified part of neck of right femur, initial encounter for closed fracture: Secondary | ICD-10-CM

## 2021-11-09 HISTORY — PX: TOTAL HIP ARTHROPLASTY: SHX124

## 2021-11-09 LAB — COMPREHENSIVE METABOLIC PANEL
ALT: 20 U/L (ref 0–44)
AST: 19 U/L (ref 15–41)
Albumin: 2.9 g/dL — ABNORMAL LOW (ref 3.5–5.0)
Alkaline Phosphatase: 104 U/L (ref 38–126)
Anion gap: 8 (ref 5–15)
BUN: 22 mg/dL (ref 8–23)
CO2: 27 mmol/L (ref 22–32)
Calcium: 9.2 mg/dL (ref 8.9–10.3)
Chloride: 101 mmol/L (ref 98–111)
Creatinine, Ser: 1.44 mg/dL — ABNORMAL HIGH (ref 0.44–1.00)
GFR, Estimated: 37 mL/min — ABNORMAL LOW (ref >60)
Glucose, Bld: 134 mg/dL — ABNORMAL HIGH (ref 70–99)
Potassium: 4.4 mmol/L (ref 3.5–5.1)
Sodium: 136 mmol/L (ref 135–145)
Total Bilirubin: 0.5 mg/dL (ref 0.3–1.2)
Total Protein: 6.9 g/dL (ref 6.5–8.1)

## 2021-11-09 LAB — CBC WITH DIFFERENTIAL/PLATELET
Abs Immature Granulocytes: 0.16 10*3/uL — ABNORMAL HIGH (ref 0.00–0.07)
Basophils Absolute: 0.1 10*3/uL (ref 0.0–0.1)
Basophils Relative: 0 %
Eosinophils Absolute: 0.1 10*3/uL (ref 0.0–0.5)
Eosinophils Relative: 0 %
HCT: 35.9 % — ABNORMAL LOW (ref 36.0–46.0)
Hemoglobin: 11.3 g/dL — ABNORMAL LOW (ref 12.0–15.0)
Immature Granulocytes: 1 %
Lymphocytes Relative: 8 %
Lymphs Abs: 1.3 10*3/uL (ref 0.7–4.0)
MCH: 28.1 pg (ref 26.0–34.0)
MCHC: 31.5 g/dL (ref 30.0–36.0)
MCV: 89.3 fL (ref 80.0–100.0)
Monocytes Absolute: 1.1 10*3/uL — ABNORMAL HIGH (ref 0.1–1.0)
Monocytes Relative: 7 %
Neutro Abs: 13.1 10*3/uL — ABNORMAL HIGH (ref 1.7–7.7)
Neutrophils Relative %: 84 %
Platelets: 757 10*3/uL — ABNORMAL HIGH (ref 150–400)
RBC: 4.02 MIL/uL (ref 3.87–5.11)
RDW: 15.8 % — ABNORMAL HIGH (ref 11.5–15.5)
WBC: 15.7 10*3/uL — ABNORMAL HIGH (ref 4.0–10.5)
nRBC: 0 % (ref 0.0–0.2)

## 2021-11-09 LAB — MAGNESIUM: Magnesium: 2.2 mg/dL (ref 1.7–2.4)

## 2021-11-09 LAB — TYPE AND SCREEN
ABO/RH(D): A POS
Antibody Screen: NEGATIVE

## 2021-11-09 LAB — ABO/RH: ABO/RH(D): A POS

## 2021-11-09 LAB — GLUCOSE, CAPILLARY
Glucose-Capillary: 112 mg/dL — ABNORMAL HIGH (ref 70–99)
Glucose-Capillary: 126 mg/dL — ABNORMAL HIGH (ref 70–99)
Glucose-Capillary: 154 mg/dL — ABNORMAL HIGH (ref 70–99)
Glucose-Capillary: 201 mg/dL — ABNORMAL HIGH (ref 70–99)

## 2021-11-09 LAB — CBG MONITORING, ED
Glucose-Capillary: 105 mg/dL — ABNORMAL HIGH (ref 70–99)
Glucose-Capillary: 134 mg/dL — ABNORMAL HIGH (ref 70–99)
Glucose-Capillary: 123 mg/dL — ABNORMAL HIGH (ref 70–99)
Glucose-Capillary: 93 mg/dL (ref 70–99)

## 2021-11-09 LAB — PHOSPHORUS: Phosphorus: 5.1 mg/dL — ABNORMAL HIGH (ref 2.5–4.6)

## 2021-11-09 SURGERY — ARTHROPLASTY, HIP, TOTAL, ANTERIOR APPROACH
Anesthesia: Spinal | Site: Hip | Laterality: Right

## 2021-11-09 MED ORDER — PROPOFOL 500 MG/50ML IV EMUL
INTRAVENOUS | Status: DC | PRN
  Administered 2021-11-09: 130 ug/kg/min via INTRAVENOUS

## 2021-11-09 MED ORDER — ALUM & MAG HYDROXIDE-SIMETH 200-200-20 MG/5ML PO SUSP
30.0000 mL | ORAL | Status: DC | PRN
  Administered 2021-11-10: 30 mL via ORAL
  Filled 2021-11-09: qty 30

## 2021-11-09 MED ORDER — DEXAMETHASONE SODIUM PHOSPHATE 10 MG/ML IJ SOLN
INTRAMUSCULAR | Status: AC
  Filled 2021-11-09: qty 1

## 2021-11-09 MED ORDER — BUPIVACAINE-EPINEPHRINE 0.25% -1:200000 IJ SOLN
INTRAMUSCULAR | Status: DC | PRN
  Administered 2021-11-09: 30 mL

## 2021-11-09 MED ORDER — DEXAMETHASONE SODIUM PHOSPHATE 10 MG/ML IJ SOLN
10.0000 mg | Freq: Once | INTRAMUSCULAR | Status: AC
  Administered 2021-11-10: 10 mg via INTRAVENOUS
  Filled 2021-11-09: qty 1

## 2021-11-09 MED ORDER — LIRAGLUTIDE 18 MG/3ML ~~LOC~~ SOPN: 1.8000 mg | PEN_INJECTOR | Freq: Every day | SUBCUTANEOUS | Status: DC

## 2021-11-09 MED ORDER — TRANEXAMIC ACID-NACL 1000-0.7 MG/100ML-% IV SOLN
1000.0000 mg | INTRAVENOUS | Status: AC
Start: 2021-11-09 — End: 2021-11-09
  Administered 2021-11-09: 1000 mg via INTRAVENOUS

## 2021-11-09 MED ORDER — HYOSCYAMINE SULFATE ER 0.375 MG PO TB12: 0.3750 mg | ORAL_TABLET | Freq: Two times a day (BID) | ORAL | Status: DC | PRN

## 2021-11-09 MED ORDER — TRANEXAMIC ACID 1000 MG/10ML IV SOLN
2000.0000 mg | INTRAVENOUS | Status: DC
  Filled 2021-11-09: qty 20

## 2021-11-09 MED ORDER — PHENYLEPHRINE HCL (PRESSORS) 10 MG/ML IV SOLN
INTRAVENOUS | Status: AC
  Filled 2021-11-09: qty 1

## 2021-11-09 MED ORDER — FLEET ENEMA 7-19 GM/118ML RE ENEM: 1.0000 | ENEMA | Freq: Once | RECTAL | Status: DC | PRN

## 2021-11-09 MED ORDER — CHLORHEXIDINE GLUCONATE CLOTH 2 % EX PADS
6.0000 | MEDICATED_PAD | Freq: Every day | CUTANEOUS | Status: DC
Start: 2021-11-09 — End: 2021-11-11
  Administered 2021-11-09 – 2021-11-11 (×3): 6 via TOPICAL

## 2021-11-09 MED ORDER — ONDANSETRON HCL 4 MG/2ML IJ SOLN: 4.0000 mg | Freq: Once | INTRAMUSCULAR | Status: DC | PRN

## 2021-11-09 MED ORDER — IRBESARTAN 150 MG PO TABS
150.0000 mg | ORAL_TABLET | Freq: Every day | ORAL | Status: DC
  Administered 2021-11-10 – 2021-11-11 (×2): 150 mg via ORAL
  Filled 2021-11-09 (×2): qty 1

## 2021-11-09 MED ORDER — DEXTROSE-NACL 5-0.45 % IV SOLN: INTRAVENOUS | Status: DC

## 2021-11-09 MED ORDER — SODIUM CHLORIDE 0.9% FLUSH
INTRAVENOUS | Status: DC | PRN
  Administered 2021-11-09: 50 mL

## 2021-11-09 MED ORDER — CHLORHEXIDINE GLUCONATE 0.12 % MT SOLN: 15.0000 mL | Freq: Once | OROMUCOSAL | Status: DC

## 2021-11-09 MED ORDER — KCL IN DEXTROSE-NACL 20-5-0.45 MEQ/L-%-% IV SOLN
INTRAVENOUS | Status: DC
  Filled 2021-11-09 (×3): qty 1000

## 2021-11-09 MED ORDER — ALUM & MAG HYDROXIDE-SIMETH 200-200-20 MG/5ML PO SUSP
30.0000 mL | Freq: Once | ORAL | Status: AC
  Administered 2021-11-09: 30 mL via ORAL
  Filled 2021-11-09: qty 30

## 2021-11-09 MED ORDER — BUPIVACAINE-EPINEPHRINE (PF) 0.25% -1:200000 IJ SOLN
INTRAMUSCULAR | Status: AC
  Filled 2021-11-09: qty 30

## 2021-11-09 MED ORDER — TRANEXAMIC ACID-NACL 1000-0.7 MG/100ML-% IV SOLN
1000.0000 mg | Freq: Once | INTRAVENOUS | Status: AC
  Administered 2021-11-09: 1000 mg via INTRAVENOUS
  Filled 2021-11-09: qty 100

## 2021-11-09 MED ORDER — PHENYLEPHRINE HCL-NACL 20-0.9 MG/250ML-% IV SOLN
INTRAVENOUS | Status: DC | PRN
  Administered 2021-11-09: 100 ug/min via INTRAVENOUS

## 2021-11-09 MED ORDER — LINAGLIPTIN 5 MG PO TABS
5.0000 mg | ORAL_TABLET | Freq: Every day | ORAL | Status: DC
  Administered 2021-11-10 – 2021-11-11 (×2): 5 mg via ORAL
  Filled 2021-11-09 (×2): qty 1

## 2021-11-09 MED ORDER — STERILE WATER FOR IRRIGATION IR SOLN
Status: DC | PRN
  Administered 2021-11-09: 2000 mL

## 2021-11-09 MED ORDER — PANTOPRAZOLE SODIUM 40 MG PO TBEC
40.0000 mg | DELAYED_RELEASE_TABLET | Freq: Every day | ORAL | Status: DC
  Administered 2021-11-10 – 2021-11-11 (×2): 40 mg via ORAL
  Filled 2021-11-09 (×2): qty 1

## 2021-11-09 MED ORDER — PHENOL 1.4 % MT LIQD: 1.0000 | OROMUCOSAL | Status: DC | PRN

## 2021-11-09 MED ORDER — ASPIRIN 81 MG PO CHEW
81.0000 mg | CHEWABLE_TABLET | Freq: Two times a day (BID) | ORAL | Status: DC
  Administered 2021-11-09 – 2021-11-11 (×4): 81 mg via ORAL
  Filled 2021-11-09 (×4): qty 1

## 2021-11-09 MED ORDER — POVIDONE-IODINE 10 % EX SWAB
2.0000 | Freq: Once | CUTANEOUS | Status: AC
  Administered 2021-11-09: 2 via TOPICAL

## 2021-11-09 MED ORDER — BUPIVACAINE IN DEXTROSE 0.75-8.25 % IT SOLN
INTRATHECAL | Status: DC | PRN
  Administered 2021-11-09: 1.7 mL via INTRATHECAL

## 2021-11-09 MED ORDER — TIZANIDINE HCL 2 MG PO TABS: 2.0000 mg | ORAL_TABLET | Freq: Four times a day (QID) | ORAL | 0 refills | Status: AC | PRN

## 2021-11-09 MED ORDER — HYDROMORPHONE HCL 2 MG PO TABS: 2.0000 mg | ORAL_TABLET | ORAL | Status: DC | PRN

## 2021-11-09 MED ORDER — EPHEDRINE SULFATE-NACL 50-0.9 MG/10ML-% IV SOSY
PREFILLED_SYRINGE | INTRAVENOUS | Status: DC | PRN
  Administered 2021-11-09 (×2): 10 mg via INTRAVENOUS
  Administered 2021-11-09: 5 mg via INTRAVENOUS

## 2021-11-09 MED ORDER — HYDROCHLOROTHIAZIDE 12.5 MG PO TABS
12.5000 mg | ORAL_TABLET | Freq: Every day | ORAL | Status: DC
  Administered 2021-11-10 – 2021-11-11 (×2): 12.5 mg via ORAL
  Filled 2021-11-09 (×2): qty 1

## 2021-11-09 MED ORDER — POVIDONE-IODINE 10 % EX SWAB: 2.0000 | Freq: Once | CUTANEOUS | Status: DC

## 2021-11-09 MED ORDER — PHENYLEPHRINE 80 MCG/ML (10ML) SYRINGE FOR IV PUSH (FOR BLOOD PRESSURE SUPPORT)
PREFILLED_SYRINGE | INTRAVENOUS | Status: DC | PRN
  Administered 2021-11-09 (×3): 200 ug via INTRAVENOUS
  Administered 2021-11-09: 160 ug via INTRAVENOUS
  Administered 2021-11-09: 200 ug via INTRAVENOUS

## 2021-11-09 MED ORDER — EPHEDRINE 5 MG/ML INJ
INTRAVENOUS | Status: AC
  Filled 2021-11-09: qty 5

## 2021-11-09 MED ORDER — BUPIVACAINE LIPOSOME 1.3 % IJ SUSP
INTRAMUSCULAR | Status: AC
  Filled 2021-11-09: qty 10

## 2021-11-09 MED ORDER — CHLORHEXIDINE GLUCONATE 4 % EX LIQD: 60.0000 mL | Freq: Once | CUTANEOUS | Status: DC

## 2021-11-09 MED ORDER — OLMESARTAN-AMLODIPINE-HCTZ 20-5-12.5 MG PO TABS: 1.0000 | ORAL_TABLET | Freq: Every day | ORAL | Status: DC

## 2021-11-09 MED ORDER — 0.9 % SODIUM CHLORIDE (POUR BTL) OPTIME
TOPICAL | Status: DC | PRN
  Administered 2021-11-09: 1000 mL

## 2021-11-09 MED ORDER — FUROSEMIDE 20 MG PO TABS: 20.0000 mg | ORAL_TABLET | Freq: Every day | ORAL | Status: DC | PRN

## 2021-11-09 MED ORDER — PROPOFOL 10 MG/ML IV BOLUS
INTRAVENOUS | Status: DC | PRN
  Administered 2021-11-09: 20 mg via INTRAVENOUS

## 2021-11-09 MED ORDER — DEXMEDETOMIDINE HCL IN NACL 80 MCG/20ML IV SOLN
INTRAVENOUS | Status: AC
  Filled 2021-11-09: qty 20

## 2021-11-09 MED ORDER — PHENYLEPHRINE 80 MCG/ML (10ML) SYRINGE FOR IV PUSH (FOR BLOOD PRESSURE SUPPORT)
PREFILLED_SYRINGE | INTRAVENOUS | Status: AC
  Filled 2021-11-09: qty 10

## 2021-11-09 MED ORDER — FLUTICASONE PROPIONATE 50 MCG/ACT NA SUSP: 1.0000 | Freq: Every day | NASAL | Status: DC | PRN

## 2021-11-09 MED ORDER — ACETAMINOPHEN 325 MG PO TABS: 325.0000 mg | ORAL_TABLET | Freq: Four times a day (QID) | ORAL | Status: DC | PRN

## 2021-11-09 MED ORDER — FENTANYL CITRATE (PF) 100 MCG/2ML IJ SOLN
INTRAMUSCULAR | Status: AC
  Filled 2021-11-09: qty 2

## 2021-11-09 MED ORDER — FENTANYL CITRATE PF 50 MCG/ML IJ SOSY: 25.0000 ug | PREFILLED_SYRINGE | INTRAMUSCULAR | Status: DC | PRN

## 2021-11-09 MED ORDER — MENTHOL 3 MG MT LOZG: 1.0000 | LOZENGE | OROMUCOSAL | Status: DC | PRN

## 2021-11-09 MED ORDER — HYOSCYAMINE SULFATE 0.125 MG SL SUBL
0.2500 mg | SUBLINGUAL_TABLET | Freq: Once | SUBLINGUAL | Status: DC
  Filled 2021-11-09: qty 2

## 2021-11-09 MED ORDER — ONDANSETRON HCL 4 MG PO TABS: 4.0000 mg | ORAL_TABLET | Freq: Three times a day (TID) | ORAL | Status: DC | PRN

## 2021-11-09 MED ORDER — ACETAMINOPHEN 500 MG PO TABS
1000.0000 mg | ORAL_TABLET | Freq: Four times a day (QID) | ORAL | Status: AC
  Administered 2021-11-09 – 2021-11-10 (×4): 1000 mg via ORAL
  Filled 2021-11-09 (×4): qty 2

## 2021-11-09 MED ORDER — METOCLOPRAMIDE HCL 5 MG PO TABS: 5.0000 mg | ORAL_TABLET | Freq: Three times a day (TID) | ORAL | Status: DC | PRN

## 2021-11-09 MED ORDER — DEXMEDETOMIDINE (PRECEDEX) IN NS 20 MCG/5ML (4 MCG/ML) IV SYRINGE
PREFILLED_SYRINGE | INTRAVENOUS | Status: DC | PRN
  Administered 2021-11-09 (×2): 4 ug via INTRAVENOUS

## 2021-11-09 MED ORDER — METOCLOPRAMIDE HCL 5 MG/ML IJ SOLN: 5.0000 mg | Freq: Three times a day (TID) | INTRAMUSCULAR | Status: DC | PRN

## 2021-11-09 MED ORDER — HYDROMORPHONE HCL 1 MG/ML IJ SOLN: 0.5000 mg | INTRAMUSCULAR | Status: DC | PRN

## 2021-11-09 MED ORDER — DEXAMETHASONE SODIUM PHOSPHATE 10 MG/ML IJ SOLN
8.0000 mg | Freq: Once | INTRAMUSCULAR | Status: AC
  Administered 2021-11-09: 8 mg via INTRAVENOUS

## 2021-11-09 MED ORDER — BUPIVACAINE LIPOSOME 1.3 % IJ SUSP
INTRAMUSCULAR | Status: DC | PRN
  Administered 2021-11-09: 10 mL

## 2021-11-09 MED ORDER — FENTANYL CITRATE (PF) 100 MCG/2ML IJ SOLN
INTRAMUSCULAR | Status: DC | PRN
  Administered 2021-11-09 (×2): 50 ug via INTRAVENOUS

## 2021-11-09 MED ORDER — ROCURONIUM BROMIDE 10 MG/ML (PF) SYRINGE
PREFILLED_SYRINGE | INTRAVENOUS | Status: AC
  Filled 2021-11-09: qty 10

## 2021-11-09 MED ORDER — SODIUM CHLORIDE (PF) 0.9 % IJ SOLN
INTRAMUSCULAR | Status: AC
  Filled 2021-11-09: qty 50

## 2021-11-09 MED ORDER — ASPIRIN 81 MG PO TBEC: 81.0000 mg | DELAYED_RELEASE_TABLET | Freq: Two times a day (BID) | ORAL | 0 refills | Status: AC

## 2021-11-09 MED ORDER — POLYETHYLENE GLYCOL 3350 17 G PO PACK: 17.0000 g | PACK | Freq: Every day | ORAL | Status: DC | PRN

## 2021-11-09 MED ORDER — DIPHENHYDRAMINE HCL 12.5 MG/5ML PO ELIX: 12.5000 mg | ORAL_SOLUTION | ORAL | Status: DC | PRN

## 2021-11-09 MED ORDER — HYDROMORPHONE HCL 1 MG/ML IJ SOLN: 0.2500 mg | INTRAMUSCULAR | Status: DC | PRN

## 2021-11-09 MED ORDER — ONDANSETRON HCL 4 MG/2ML IJ SOLN
INTRAMUSCULAR | Status: DC | PRN
  Administered 2021-11-09: 4 mg via INTRAVENOUS

## 2021-11-09 MED ORDER — TRANEXAMIC ACID-NACL 1000-0.7 MG/100ML-% IV SOLN
INTRAVENOUS | Status: AC
  Filled 2021-11-09: qty 100

## 2021-11-09 MED ORDER — ONDANSETRON HCL 4 MG/2ML IJ SOLN
INTRAMUSCULAR | Status: AC
  Filled 2021-11-09: qty 2

## 2021-11-09 MED ORDER — OXYCODONE-ACETAMINOPHEN 5-325 MG PO TABS: 1.0000 | ORAL_TABLET | ORAL | 0 refills | Status: AC | PRN

## 2021-11-09 MED ORDER — PRAVASTATIN SODIUM 20 MG PO TABS
20.0000 mg | ORAL_TABLET | Freq: Every day | ORAL | Status: DC
Start: 2021-11-10 — End: 2021-11-11
  Administered 2021-11-10 – 2021-11-11 (×2): 20 mg via ORAL
  Filled 2021-11-09 (×2): qty 1

## 2021-11-09 MED ORDER — PROPOFOL 10 MG/ML IV BOLUS
INTRAVENOUS | Status: AC
  Filled 2021-11-09: qty 20

## 2021-11-09 MED ORDER — OXYCODONE HCL 5 MG PO TABS
5.0000 mg | ORAL_TABLET | ORAL | Status: DC | PRN
  Administered 2021-11-10 (×3): 5 mg via ORAL
  Administered 2021-11-11: 10 mg via ORAL
  Administered 2021-11-11: 5 mg via ORAL
  Filled 2021-11-09 (×2): qty 1
  Filled 2021-11-09: qty 2
  Filled 2021-11-09 (×2): qty 1

## 2021-11-09 MED ORDER — LACTATED RINGERS IV SOLN
INTRAVENOUS | Status: DC
  Administered 2021-11-09: 1000 mL via INTRAVENOUS

## 2021-11-09 MED ORDER — CEFAZOLIN SODIUM-DEXTROSE 2-4 GM/100ML-% IV SOLN
2.0000 g | INTRAVENOUS | Status: AC
  Administered 2021-11-09: 2 g via INTRAVENOUS
  Filled 2021-11-09: qty 100

## 2021-11-09 MED ORDER — DOCUSATE SODIUM 100 MG PO CAPS
100.0000 mg | ORAL_CAPSULE | Freq: Two times a day (BID) | ORAL | Status: DC
  Administered 2021-11-09 – 2021-11-11 (×4): 100 mg via ORAL
  Filled 2021-11-09 (×4): qty 1

## 2021-11-09 MED ORDER — TRANEXAMIC ACID 1000 MG/10ML IV SOLN
INTRAVENOUS | Status: DC | PRN
  Administered 2021-11-09: 2000 mg via TOPICAL

## 2021-11-09 MED ORDER — AMLODIPINE BESYLATE 5 MG PO TABS
5.0000 mg | ORAL_TABLET | Freq: Every day | ORAL | Status: DC
  Administered 2021-11-10 – 2021-11-11 (×2): 5 mg via ORAL
  Filled 2021-11-09 (×2): qty 1

## 2021-11-09 MED ORDER — OXYCODONE HCL 5 MG/5ML PO SOLN: 5.0000 mg | Freq: Once | ORAL | Status: DC | PRN

## 2021-11-09 MED ORDER — LIDOCAINE HCL (PF) 2 % IJ SOLN
INTRAMUSCULAR | Status: AC
  Filled 2021-11-09: qty 5

## 2021-11-09 MED ORDER — OXYCODONE HCL 5 MG PO TABS: 5.0000 mg | ORAL_TABLET | Freq: Once | ORAL | Status: DC | PRN

## 2021-11-09 MED ORDER — TRANEXAMIC ACID 650 MG PO TABS (ORTHO): 1950.0000 mg | ORAL_TABLET | ORAL | Status: DC

## 2021-11-09 MED ORDER — VANCOMYCIN HCL 125 MG PO CAPS
125.0000 mg | ORAL_CAPSULE | Freq: Four times a day (QID) | ORAL | Status: DC
  Administered 2021-11-09 – 2021-11-11 (×8): 125 mg via ORAL
  Filled 2021-11-09 (×9): qty 1

## 2021-11-09 SURGICAL SUPPLY — 46 items
BAG COUNTER SPONGE SURGICOUNT (BAG) ×2 IMPLANT
BAG DECANTER FOR FLEXI CONT (MISCELLANEOUS) ×4 IMPLANT
BAG SPNG CNTER NS LX DISP (BAG) ×1
BLADE SAW SGTL 18X1.27X75 (BLADE) ×2 IMPLANT
COVER PERINEAL POST (MISCELLANEOUS) ×2 IMPLANT
COVER SURGICAL LIGHT HANDLE (MISCELLANEOUS) ×2 IMPLANT
CUP ACETBLR 52 OD 100 SERIES (Hips) ×1 IMPLANT
DRAPE STERI IOBAN 125X83 (DRAPES) ×2 IMPLANT
DRAPE U-SHAPE 47X51 STRL (DRAPES) ×4 IMPLANT
DRSG AQUACEL AG ADV 3.5X10 (GAUZE/BANDAGES/DRESSINGS) ×2 IMPLANT
DURAPREP 26ML APPLICATOR (WOUND CARE) ×2 IMPLANT
ELECT BLADE TIP CTD 4 INCH (ELECTRODE) ×2 IMPLANT
ELECT REM PT RETURN 15FT ADLT (MISCELLANEOUS) ×2 IMPLANT
ELIMINATOR HOLE APEX DEPUY (Hips) ×1 IMPLANT
GLOVE BIO SURGEON STRL SZ7.5 (GLOVE) ×2 IMPLANT
GLOVE BIO SURGEON STRL SZ8.5 (GLOVE) ×2 IMPLANT
GLOVE BIOGEL PI IND STRL 8 (GLOVE) ×1 IMPLANT
GLOVE BIOGEL PI IND STRL 9 (GLOVE) ×1 IMPLANT
GLOVE BIOGEL PI INDICATOR 8 (GLOVE) ×1
GLOVE BIOGEL PI INDICATOR 9 (GLOVE) ×1
GOWN STRL REUS W/ TWL XL LVL3 (GOWN DISPOSABLE) ×2 IMPLANT
GOWN STRL REUS W/TWL XL LVL3 (GOWN DISPOSABLE) ×8
HEAD M SROM 36MM PLUS 1.5 (Hips) IMPLANT
HOLDER FOLEY CATH W/STRAP (MISCELLANEOUS) ×2 IMPLANT
KIT TURNOVER KIT A (KITS) ×1 IMPLANT
LINER NEUTRAL 52X36MM PLUS 4 (Liner) ×1 IMPLANT
MANIFOLD NEPTUNE II (INSTRUMENTS) ×2 IMPLANT
NDL HYPO 21X1.5 SAFETY (NEEDLE) ×2 IMPLANT
NEEDLE HYPO 21X1.5 SAFETY (NEEDLE) ×4 IMPLANT
NS IRRIG 1000ML POUR BTL (IV SOLUTION) ×2 IMPLANT
PACK ANTERIOR HIP CUSTOM (KITS) ×2 IMPLANT
SPIKE FLUID TRANSFER (MISCELLANEOUS) ×1 IMPLANT
SROM M HEAD 36MM PLUS 1.5 (Hips) ×2 IMPLANT
STEM FEM ACTIS HIGH SZ3 (Stem) ×1 IMPLANT
SUT ETHIBOND NAB CT1 #1 30IN (SUTURE) ×2 IMPLANT
SUT VIC AB 0 CT1 27 (SUTURE)
SUT VIC AB 0 CT1 27XBRD ANBCTR (SUTURE) IMPLANT
SUT VIC AB 1 CTX 36 (SUTURE) ×2
SUT VIC AB 1 CTX36XBRD ANBCTR (SUTURE) ×1 IMPLANT
SUT VIC AB 2-0 CT1 27 (SUTURE) ×2
SUT VIC AB 2-0 CT1 TAPERPNT 27 (SUTURE) ×1 IMPLANT
SUT VIC AB 3-0 CT1 27 (SUTURE) ×2
SUT VIC AB 3-0 CT1 TAPERPNT 27 (SUTURE) ×1 IMPLANT
SYR CONTROL 10ML LL (SYRINGE) ×6 IMPLANT
TRAY FOLEY MTR SLVR 16FR STAT (SET/KITS/TRAYS/PACK) IMPLANT
TRAY FOLEY W/BAG SLVR 14FR LF (SET/KITS/TRAYS/PACK) ×1 IMPLANT

## 2021-11-09 NOTE — Anesthesia Postprocedure Evaluation (Signed)
Anesthesia Post Note  Patient: Natasha Chan  Procedure(s) Performed: TOTAL HIP ARTROPLASTY ANTERIOR APPROACH (Right: Hip)     Patient location during evaluation: PACU Anesthesia Type: Spinal Level of consciousness: oriented and awake and alert Pain management: pain level controlled Vital Signs Assessment: post-procedure vital signs reviewed and stable Respiratory status: spontaneous breathing, respiratory function stable and nonlabored ventilation Cardiovascular status: blood pressure returned to baseline and stable Postop Assessment: no headache, no backache, no apparent nausea or vomiting and spinal receding Anesthetic complications: no   No notable events documented.  Last Vitals:  Vitals:   11/09/21 1515 11/09/21 1530  BP: 123/81 117/87  Pulse: 94 97  Resp: 14 14  Temp:    SpO2: 98% 98%    Last Pain:  Vitals:   11/09/21 1530  TempSrc:   PainSc: Asleep                 Siniyah Evangelist A.

## 2021-11-09 NOTE — Op Note (Signed)
PATIENT ID:      Natasha Chan  MRN:     [id number] DOB/AGE:    79-May-1942 / 79 y.o.  OPERATIVE REPORT   DATE OF PROCEDURE:  11/09/2021      PREOPERATIVE DIAGNOSIS:  right femoral neck fracture                                                         POSTOPERATIVE DIAGNOSIS:  Same                                                         PROCEDURE: Anterior R total hip arthroplasty using a 52 mm DePuy Pinnacle  Cup, [employer], 0-degree polyethylene liner, a +1.5 mm x 56mm metal head, a 3 hi Depuy Actis stem  SURGEON: Nestor Lewandowsky  ASSISTANT:   Natasha Chan. Natasha Chan  (present throughout entire procedure and necessary for timely completion of the procedure)   ANESTHESIA: Spinal, Exparel 133mg  injection BLOOD LOSS: 300 cc FLUID REPLACEMENT: 1500 cc crystalloid TRANEXAMIC ACID: 1gm IV, 2gm Topical COMPLICATIONS: none    INDICATIONS FOR PROCEDURE: A 81 y.o. year-old With  right femoral neck fracture  11/08/21.  Radiographs also show pre-existing loss of one half of the articular cartilage.  Patient desires urgent R total hip arthroplasty to decrease pain and increase function. The risks, benefits, and alternatives were discussed at length including but not limited to the risks of infection, bleeding, nerve injury, stiffness, blood clots, the need for revision surgery, cardiopulmonary complications, among others, and they were willing to proceed. Questions answered      PROCEDURE IN DETAIL: The patient was identified by armband,   received preoperative IV antibiotics in the holding area at [facility], taken to the operating room , appropriate anesthetic monitors   were attached and anesthesia was induced with the patient on the gurney. HANA boots were applied to the feet, and the patient  was transferred to the HANA table with a peroneal post and support underneath the non-operative leg. Theoperative lower extremity was then prepped and draped in the usual sterile fashion from  just above the iliac crest to the knee. And a timeout procedure was performed. [facility]. Natasha Chan [facility] was present and scrubbed throughout the case, critical for assistance with, positioning, exposure, retraction, instrumentation, and closure.Skin along incision area was injected with 10 cc of Exparel solution. We then made a 13 cm incision along the interval at the leading edge of the tensor fascia lata of starting at 2 cm lateral to the ASIS. Small bleeders in the skin and subcutaneous tissue identified and cauterized we dissected down to the fascia and made an incision in the fascia allowing [facility] to elevate the fascia of the tensor muscle and exploited the interval between the rectus and the tensor fascia lata. A Cobra retractor was then placed along the superior neck of the femur. A cerebellar retractor was used to expose the interval between the tensor fascia lata and the rectus femoris.  We identified and cauterized the ascending branch of the anterior circumflex artery. A second Cobra retractor along the inferior neck of  the femur. A small Hohmann retractor was placed underneath the origin of the rectus femoris, giving [place] good medial exposure. Using Ronguers fatty tissue was removed from in front of the anterior capsule. The capsule was then incised, starting out at the superior anterior rim of the acetabulum going laterally along the anterior neck. The capsule was then teed along the neck superiorly and inferiorly. Electrocautery was used to release capsule from the anterior and medial neck of the femur to allow external rotation. Cobra retractors were then placed along the inferior and superior neck allowing [place] to perform a standard neck cut and removed the femoral head with a power corkscrew. We then placed a medium bent homan retractor in the cotyloid notch and posteriorly along the acetabular rim a narrow Cobra retractor. Exposed labral tissue and osteophytes were then removed. We then sequentially reamed up  to a 51 mm basket reamer obtaining good coverage in all quadrants, verified by C-arm imaging. Under C-arm control we then hammered into place a 52 mm Pinnacle cup in 45 of abduction and 15 of anteversion. The cup seated nicely and required no supplemental screws. We then placed a central hole Eliminator and a 0 polyethylene liner. The foot was then externally rotated to 130-140. The limb was extended and adducted to the floor, delivering the proximal femur up into the wound. A medium curved Hohmann retractor was placed over the greater trochanter and a long Homan retractor along the posterior femoral neck completing the exposure and lateralizing the femur. We then performed releases superiorly and and inferiorly of the capsule going back to the pirformis fossa superiorly and to the lesser trochanter inferiorly. We then entered the proximal femur with the box cutting offset chisel followed by, a canal sounder, the chili pepper and broaching up to a 3 broach. This seated nicely and we reamed the calcar. A trial reduction was performed with a 1.5 mm X 36 mm head.The limb lengths were excellent the hip was stable in 90 of external rotation. At this point the trial components removed and we hammered into place a # 3 hi  Offset Actis stem with Gryption coating. A + 1.5 mm x 36 metal head was then hammered into place. The hip was reduced and final C-arm images obtained. The wound was thoroughly irrigated with normal saline solution. We repaired the ant capsule and the tensor fascia lot a with running 0 vicryl suture. the subcutaneous tissue was closed with 2-0 and 3-0 Vicryl suture followed by an Aquacil dressing. At this point the patient was awaken and transferred to hospital gurney without difficulty.   Nestor Lewandowsky 11/09/2021, 12:52 PM

## 2021-11-09 NOTE — Interval H&P Note (Signed)
History and Physical Interval Note:  11/09/2021 12:52 PM  Natasha Chan  has presented today for surgery, with the diagnosis of right femoral neck fracture.  The various methods of treatment have been discussed with the patient and family. After consideration of risks, benefits and other options for treatment, the patient has consented to  Procedure(s): TOTAL HIP ARTROPLASTY ANTERIOR APPROACH (Right) as a surgical intervention.  The patient's history has been reviewed, patient examined, no change in status, stable for surgery.  I have reviewed the patient's chart and labs.  Questions were answered to the patient's satisfaction.     Nestor Lewandowsky

## 2021-11-09 NOTE — ED Notes (Signed)
CBG 134 

## 2021-11-09 NOTE — Anesthesia Procedure Notes (Signed)
Spinal  Patient location during procedure: OR Start time: 11/09/2021 1:16 PM End time: 11/09/2021 1:20 PM Reason for block: surgical anesthesia Staffing Performed: anesthesiologist  Anesthesiologist: Mal Amabile, MD Performed by: Mal Amabile, MD Authorized by: Mal Amabile, MD   Preanesthetic Checklist Completed: patient identified, IV checked, site marked, risks and benefits discussed, surgical consent, monitors and equipment checked, pre-op evaluation and timeout performed Spinal Block Patient position: right lateral decubitus Prep: DuraPrep and site prepped and draped Patient monitoring: heart rate, cardiac monitor, continuous pulse ox and blood pressure Approach: midline Location: L3-4 Injection technique: single-shot Needle Needle type: Pencan  Needle gauge: 24 G Needle length: 9 cm Needle insertion depth: 7 cm Assessment Sensory level: T4 Events: CSF return Additional Notes Patient tolerated procedure well. Adequate sensory level.

## 2021-11-09 NOTE — Transfer of Care (Signed)
Immediate Anesthesia Transfer of Care Note  Patient: Natasha Chan  Procedure(s) Performed: TOTAL HIP ARTROPLASTY ANTERIOR APPROACH (Right: Hip)  Patient Location: PACU  Anesthesia Type:Spinal  Level of Consciousness: sedated  Airway & Oxygen Therapy: Patient Spontanous Breathing and Patient connected to face mask oxygen  Post-op Assessment: Report given to RN and Post -op Vital signs reviewed and stable  Post vital signs: Reviewed and stable  Last Vitals:  Vitals Value Taken Time  BP 97/61 11/09/21 1458  Temp    Pulse 95 11/09/21 1500  Resp 9 11/09/21 1500  SpO2 95 % 11/09/21 1500  Vitals shown include unvalidated device data.  Last Pain:  Vitals:   11/09/21 1256  TempSrc:   PainSc: 7          Complications: No notable events documented.

## 2021-11-09 NOTE — Anesthesia Preprocedure Evaluation (Addendum)
Anesthesia Evaluation  Patient identified by MRN, date of birth, ID band Patient awake    Reviewed: Allergy & Precautions, H&P , NPO status , Patient's Chart, lab work & pertinent test results, reviewed documented beta blocker date and time   History of Anesthesia Complications (+) PONV and history of anesthetic complications  Airway Mallampati: III  TM Distance: >3 FB Neck ROM: Full    Dental  (+) Upper Dentures, Lower Dentures   Pulmonary neg pulmonary ROS,    Pulmonary exam normal breath sounds clear to auscultation       Cardiovascular hypertension, Pt. on medications Normal cardiovascular exam Rhythm:Regular Rate:Normal     Neuro/Psych  Neuromuscular disease negative psych ROS   GI/Hepatic negative GI ROS, Neg liver ROS,   Endo/Other  diabetes, Well Controlled, Type 2, Oral Hypoglycemic AgentsObesity Hyperlipidemia  Renal/GU negative Renal ROS  negative genitourinary   Musculoskeletal  (+) Arthritis , Osteoarthritis,  Fibromyalgia -  Abdominal (+) + obese,   Peds  Hematology negative hematology ROS (+)   Anesthesia Other Findings   Reproductive/Obstetrics negative OB ROS                            Anesthesia Physical Anesthesia Plan  ASA: 2  Anesthesia Plan: Spinal   Post-op Pain Management: Ofirmev IV (intra-op)* and Regional block*   Induction: Intravenous  PONV Risk Score and Plan: 4 or greater and Propofol infusion, Ondansetron and Dexamethasone  Airway Management Planned: Natural Airway and Simple Face Mask  Additional Equipment: None  Intra-op Plan:   Post-operative Plan:   Informed Consent: I have reviewed the patients History and Physical, chart, labs and discussed the procedure including the risks, benefits and alternatives for the proposed anesthesia with the patient or authorized representative who has indicated his/her understanding and acceptance.      Dental advisory given  Plan Discussed with: CRNA  Anesthesia Plan Comments:       Anesthesia Quick Evaluation

## 2021-11-09 NOTE — Progress Notes (Signed)
PT Cancellation Note  Patient Details Name: Natasha Chan MRN: [id number] DOB: May 09, 1940   Cancelled Treatment:     PT order received but eval deferred.  Pt with hip fx and scheduled for surgery this date.  Please reorder PT post op.   [place] Kirchner 11/09/2021, 7:06 AM

## 2021-11-09 NOTE — Discharge Instructions (Signed)

## 2021-11-09 NOTE — Progress Notes (Signed)
PROGRESS NOTE    Natasha Chan  ZJI:[id number] DOB: 1941/01/18 DOA: 11/08/2021 PCP: Galvin Proffer, MD     Brief Narrative:  Natasha Chan  81 year old WF PMHx DM type II controlled without complication, HLD, essential HTN, Thyroid disease, Fibromyalgia, Gout   Here with mechanical fall just prior to arrival.  [place] onto her RIGHT hip.  Did not strike her head and no loss of consciousness.  No chest or abdominal discomfort.  Complains of sharp pain to her right hip that is worse with movement.  She cannot ambulate.  Denies any distal numbness or tingling to her right foot.    Subjective: 7/7 afebrile overnight attempted to see patient twice.  In OR.   Assessment & Plan: Covid vaccination;   Principal Problem:   Displaced fracture of right femoral neck (HCC) Active Problems:   Diabetes mellitus without complication (HCC)   Hypertension   Thyroid disease   Fibromyalgia   Gout  Displaced fracture RIGHT femoral neck - Per Dr. Gean Birchwood [facility] reserved OR time for tomorrow at 1:00 for anterior right hip replacement -7/7 currently remains in OR   DM type II without complications -Moderate SSI  Essential HTN - Hold home medication.  Stable  Thyroid disease - Home medication  Fibromyalgia - Home medication  Gout - Home medication  Obesity (BMI 30.63 kg/m.)   GERD - 7/7 repeat GI cocktail     Mobility Assessment (last 72 hours)     Mobility Assessment   No documentation.             Interdisciplinary Goals of Care Family Meeting   Date carried out: 11/09/2021  Location of the meeting:   Member's involved:   Durable Power of Insurance risk surveyor:     Discussion: We discussed goals of care for [employer] .    Code status:   Disposition:   Time spent for the meeting:     Rayan Ines J, MD  11/09/2021, 7:52 AM         DVT prophylaxis:  Code Status: Full Family Communication:  Status  is: Inpatient    Dispo: The patient is from: Home              Anticipated d/c is to: SNF              Anticipated d/c date is: 3 days              Patient currently is not medically stable to d/c.      Consultants:  Orthopedic surgery  Procedures/Significant Events:    I have personally reviewed and interpreted all radiology studies and my findings are as above.  VENTILATOR SETTINGS:    Cultures   Antimicrobials: Anti-infectives (From admission, onward)    Start     Dose/Rate Route Frequency Ordered Stop   11/09/21 1800  vancomycin (VANCOCIN) capsule 125 mg        125 mg Oral 4 times daily 11/09/21 1723     11/09/21 1245  ceFAZolin (ANCEF) IVPB 2g/100 mL premix        2 g 200 mL/hr over 30 Minutes Intravenous On call to O.R. 11/09/21 0752 11/09/21 1321         Devices    LINES / TUBES:      Continuous Infusions:  sodium chloride 75 mL/hr at 11/08/21 1739   sodium chloride     methocarbamol (ROBAXIN) IV  Objective: Vitals:   11/09/21 0545 11/09/21 0600 11/09/21 0615 11/09/21 0700  BP: 119/79 123/86 113/79 122/62  Pulse: 100 (!) 108 (!) 106 (!) 108  Resp: 10 16 17 15   Temp:      TempSrc:      SpO2: 96% 95% 95% 96%  Weight:      Height:       No intake or output data in the 24 hours ending 11/09/21 0752 Filed Weights   11/08/21 1326  Weight: 83.5 kg    Examination:  Attempted to see patient twice remains in OR for hip replacement. .     Data Reviewed: Care during the described time interval was provided by me .  I have reviewed this patient's available data, including medical history, events of note, physical examination, and all test results as part of my evaluation.  CBC: Recent Labs  Lab 11/08/21 1339 11/08/21 1705 11/09/21 0500  WBC 15.2* 23.6* 15.7*  NEUTROABS 12.8*  --  13.1*  HGB 11.7* 11.6* 11.3*  HCT 36.1 36.1 35.9*  MCV 86.8 87.2 89.3  PLT 774* 801* 757*   Basic Metabolic Panel: Recent Labs  Lab  11/08/21 1339 11/08/21 1705 11/09/21 0500  NA 137  --  136  K 4.5  --  4.4  CL 103  --  101  CO2 24  --  27  GLUCOSE 136*  --  134*  BUN 22  --  22  CREATININE 1.68* 1.49* 1.44*  CALCIUM 9.5  --  9.2  MG  --   --  2.2  PHOS  --   --  5.1*   GFR: Estimated Creatinine Clearance: 34.4 mL/min (A) (by C-G formula based on SCr of 1.44 mg/dL (H)). Liver Function Tests: Recent Labs  Lab 11/09/21 0500  AST 19  ALT 20  ALKPHOS 104  BILITOT 0.5  PROT 6.9  ALBUMIN 2.9*   No results for input(s): "LIPASE", "AMYLASE" in the last 168 hours. No results for input(s): "AMMONIA" in the last 168 hours. Coagulation Profile: No results for input(s): "INR", "PROTIME" in the last 168 hours. Cardiac Enzymes: No results for input(s): "CKTOTAL", "CKMB", "CKMBINDEX", "TROPONINI" in the last 168 hours. BNP (last 3 results) No results for input(s): "PROBNP" in the last 8760 hours. HbA1C: Recent Labs    11/08/21 1339  HGBA1C 6.4*   CBG: Recent Labs  Lab 11/09/21 0100 11/09/21 0526 11/09/21 0722  GLUCAP 105* 134* 123*   Lipid Profile: Recent Labs    11/08/21 1339  CHOL 133  HDL 28*  LDLCALC 78  TRIG 01/09/22  CHOLHDL 4.8   Thyroid Function Tests: No results for input(s): "TSH", "T4TOTAL", "FREET4", "T3FREE", "THYROIDAB" in the last 72 hours. Anemia Panel: No results for input(s): "VITAMINB12", "FOLATE", "FERRITIN", "TIBC", "IRON", "RETICCTPCT" in the last 72 hours. Sepsis Labs: No results for input(s): "PROCALCITON", "LATICACIDVEN" in the last 168 hours.  Recent Results (from the past 240 hour(s))  SARS Coronavirus 2 by RT PCR (hospital order, performed in [facility] hospital lab) *cepheid single result test* Anterior Nasal Swab     Status: None   Collection Time: 11/08/21  4:11 PM   Specimen: Anterior Nasal Swab  Result Value Ref Range Status   SARS Coronavirus 2 by RT PCR NEGATIVE NEGATIVE Final    Comment: (NOTE) SARS-CoV-2 target nucleic acids are NOT DETECTED.  The  SARS-CoV-2 RNA is generally detectable in upper and lower respiratory specimens during the acute phase of infection. The lowest concentration of SARS-CoV-2 viral copies this  assay can detect is 250 copies / mL. A negative result does not preclude SARS-CoV-2 infection and should not be used as the sole basis for treatment or other patient management decisions.  A negative result may occur with improper specimen collection / handling, submission of specimen other than nasopharyngeal swab, presence of viral mutation(s) within the areas targeted by this assay, and inadequate number of viral copies (<250 copies / mL). A negative result must be combined with clinical observations, patient history, and epidemiological information.  Fact Sheet for Patients:   RoadLapTop.co.za  Fact Sheet for Healthcare Providers: http://kim-miller.com/  This test is not yet approved or  cleared by the [place] FDA and has been authorized for detection and/or diagnosis of SARS-CoV-2 by FDA under an Emergency Use Authorization (EUA).  This EUA will remain in effect (meaning this test can be used) for the duration of the COVID-19 declaration under Section 564(b)(1) of the Act, 21 U.S.C. section 360bbb-3(b)(1), unless the authorization is terminated or revoked sooner.  Performed at [facility], [address], [place], [place] [address]          Radiology Studies: DG Chest 1 View  Result Date: 11/08/2021 CLINICAL DATA:  Fall, right hip pain EXAM: CHEST  1 VIEW COMPARISON:  08/08/2016 FINDINGS: Low lung volumes are present, causing crowding of the pulmonary vasculature. Cardiac and mediastinal margins appear normal. Linear subsegmental atelectasis or scarring along the left hemidiaphragm. No blunting of the costophrenic angles. IMPRESSION: 1. Linear subsegmental atelectasis or scarring at the left lung base. 2. Low lung volumes are present,  causing crowding of the pulmonary vasculature. Electronically Signed   By: Gaylyn Rong M.D.   On: 11/08/2021 14:32   DG Hip Unilat W or Wo Pelvis 2-3 Views Right  Result Date: 11/08/2021 CLINICAL DATA:  Larey Seat.  Right hip pain. EXAM: DG HIP (WITH OR WITHOUT PELVIS) 2-3V RIGHT COMPARISON:  None Available. FINDINGS: There is a displaced right femoral neck fracture. The left hip is intact. The pubic symphysis and SI joints are intact. No pelvic fractures. IMPRESSION: Displaced right femoral neck fracture. Electronically Signed   By: Rudie Meyer M.D.   On: 11/08/2021 14:26        Scheduled Meds:  allopurinol  100 mg Oral Daily   busPIRone  10 mg Oral BID   DULoxetine  120 mg Oral Daily   heparin  5,000 Units Subcutaneous Q8H   insulin aspart  0-15 Units Subcutaneous Q4H   mometasone-formoterol  2 puff Inhalation BID   QUEtiapine  100 mg Oral QHS   thyroid  30 mg Oral Daily   Continuous Infusions:  sodium chloride 75 mL/hr at 11/08/21 1739   sodium chloride     methocarbamol (ROBAXIN) IV       LOS: 1 day    Time spent:40 min    Kamaryn Grimley, Roselind Messier, MD [facility]   If 7PM-7AM, please contact night-coverage 11/09/2021, 7:52 AM

## 2021-11-10 DIAGNOSIS — E119 Type 2 diabetes mellitus without complications: Secondary | ICD-10-CM | POA: Diagnosis not present

## 2021-11-10 DIAGNOSIS — A0472 Enterocolitis due to Clostridium difficile, not specified as recurrent: Secondary | ICD-10-CM

## 2021-11-10 DIAGNOSIS — M797 Fibromyalgia: Secondary | ICD-10-CM | POA: Diagnosis not present

## 2021-11-10 DIAGNOSIS — S72001A Fracture of unspecified part of neck of right femur, initial encounter for closed fracture: Secondary | ICD-10-CM | POA: Diagnosis not present

## 2021-11-10 LAB — CBC WITH DIFFERENTIAL/PLATELET
Abs Immature Granulocytes: 0.18 10*3/uL — ABNORMAL HIGH (ref 0.00–0.07)
Basophils Absolute: 0 10*3/uL (ref 0.0–0.1)
Basophils Relative: 0 %
Eosinophils Absolute: 0 10*3/uL (ref 0.0–0.5)
Eosinophils Relative: 0 %
HCT: 30.9 % — ABNORMAL LOW (ref 36.0–46.0)
Hemoglobin: 9.8 g/dL — ABNORMAL LOW (ref 12.0–15.0)
Immature Granulocytes: 1 %
Lymphocytes Relative: 4 %
Lymphs Abs: 0.9 10*3/uL (ref 0.7–4.0)
MCH: 28.1 pg (ref 26.0–34.0)
MCHC: 31.7 g/dL (ref 30.0–36.0)
MCV: 88.5 fL (ref 80.0–100.0)
Monocytes Absolute: 1 10*3/uL (ref 0.1–1.0)
Monocytes Relative: 5 %
Neutro Abs: 19.1 10*3/uL — ABNORMAL HIGH (ref 1.7–7.7)
Neutrophils Relative %: 90 %
Platelets: 620 10*3/uL — ABNORMAL HIGH (ref 150–400)
RBC: 3.49 MIL/uL — ABNORMAL LOW (ref 3.87–5.11)
RDW: 15.4 % (ref 11.5–15.5)
WBC: 21.2 10*3/uL — ABNORMAL HIGH (ref 4.0–10.5)
nRBC: 0 % (ref 0.0–0.2)

## 2021-11-10 LAB — COMPREHENSIVE METABOLIC PANEL
ALT: 13 U/L (ref 0–44)
AST: 20 U/L (ref 15–41)
Albumin: 2.6 g/dL — ABNORMAL LOW (ref 3.5–5.0)
Alkaline Phosphatase: 86 U/L (ref 38–126)
Anion gap: 9 (ref 5–15)
BUN: 22 mg/dL (ref 8–23)
CO2: 25 mmol/L (ref 22–32)
Calcium: 8.8 mg/dL — ABNORMAL LOW (ref 8.9–10.3)
Chloride: 100 mmol/L (ref 98–111)
Creatinine, Ser: 1.53 mg/dL — ABNORMAL HIGH (ref 0.44–1.00)
GFR, Estimated: 35 mL/min — ABNORMAL LOW (ref >60)
Glucose, Bld: 156 mg/dL — ABNORMAL HIGH (ref 70–99)
Potassium: 5.1 mmol/L (ref 3.5–5.1)
Sodium: 134 mmol/L — ABNORMAL LOW (ref 135–145)
Total Bilirubin: 0.6 mg/dL (ref 0.3–1.2)
Total Protein: 6.6 g/dL (ref 6.5–8.1)

## 2021-11-10 LAB — GLUCOSE, CAPILLARY
Glucose-Capillary: 124 mg/dL — ABNORMAL HIGH (ref 70–99)
Glucose-Capillary: 152 mg/dL — ABNORMAL HIGH (ref 70–99)
Glucose-Capillary: 162 mg/dL — ABNORMAL HIGH (ref 70–99)
Glucose-Capillary: 163 mg/dL — ABNORMAL HIGH (ref 70–99)
Glucose-Capillary: 205 mg/dL — ABNORMAL HIGH (ref 70–99)
Glucose-Capillary: 207 mg/dL — ABNORMAL HIGH (ref 70–99)
Glucose-Capillary: 289 mg/dL — ABNORMAL HIGH (ref 70–99)

## 2021-11-10 LAB — MAGNESIUM: Magnesium: 2.3 mg/dL (ref 1.7–2.4)

## 2021-11-10 LAB — PHOSPHORUS: Phosphorus: 2.4 mg/dL — ABNORMAL LOW (ref 2.5–4.6)

## 2021-11-10 MED ORDER — MAGIC MOUTHWASH
15.0000 mL | Freq: Three times a day (TID) | ORAL | Status: DC | PRN
  Administered 2021-11-10: 15 mL via ORAL
  Filled 2021-11-10 (×2): qty 15

## 2021-11-10 NOTE — Progress Notes (Incomplete)
Initial Nutrition Assessment  DOCUMENTATION CODES:      INTERVENTION:  ***   NUTRITION DIAGNOSIS:     related to   as evidenced by  .  ***  GOAL:      ***  MONITOR:      REASON FOR ASSESSMENT:   Consult Hip fracture protocol  ASSESSMENT:    81 yo female admitted with closed displaced fracture of right femoral neck. PMH includes DM, HTN, thyroid diseae, gout, fibromyalgia  7/07 Anterior R total hip arthroplasty  Labs: CBGs 152-289 Meds:   ***   NUTRITION - FOCUSED PHYSICAL EXAM:  {RD Focused Exam List:21252}  Diet Order:   Diet Order             Diet Carb Modified Fluid consistency: Thin; Room service appropriate? Yes  Diet effective now                   EDUCATION NEEDS:      Skin:     Last BM:     Height:   Ht Readings from Last 1 Encounters:  11/08/21 5\' 5"  (1.651 m)    Weight:   Wt Readings from Last 1 Encounters:  11/09/21 83.5 kg    Ideal Body Weight:     BMI:  Body mass index is 30.63 kg/m.  Estimated Nutritional Needs:   Kcal:     Protein:     Fluid:      01/10/22 MS, RDN, LDN, CNSC Registered Dietitian 3 Clinical Nutrition RD Pager and On-Call Pager Number Located in [place]

## 2021-11-10 NOTE — Progress Notes (Addendum)
Physical Therapy Treatment Patient Details Name: Natasha Chan MRN: [id number] DOB: [birth year] Today's Date: 11/10/2021   History of Present Illness Patient is 81 y.o. female s/p Rt THA on 11/09/21 due to Rt femoral neck fracture 2/2 fall. Patient was at home taking care of her grandchild around 1030 this morning was lifting the child into a crib slipped and fell onto her right side sustaining a displaced right femoral neck fracture. PMH significant for OA, DM, HTN, fibromyalgia, PE, Rt TKA, colon resection.    PT Comments    Patient making excellent progress with mobility and demonstrates safe technique and walker management for transfers and gait. She ambulated ~180' with RW and supervision and completed stair mobility with supervision and assist for walker management. EOS reviewed HEP for seated/supine exercises and addressed questions for discharge. She is mobilizing at safe level for return home. Will progress during acute stay.    Recommendations for follow up therapy are one component of a multi-disciplinary discharge planning process, led by the attending physician.  Recommendations may be updated based on patient status, additional functional criteria and insurance authorization.  Follow Up Recommendations  Follow physician's recommendations for discharge plan and follow up therapies     Assistance Recommended at Discharge Intermittent Supervision/Assistance  Patient can return home with the following A little help with walking and/or transfers;A little help with bathing/dressing/bathroom;Assistance with cooking/housework;Direct supervision/assist for medications management;Direct supervision/assist for financial management;Assist for transportation;Help with stairs or ramp for entrance   Equipment Recommendations  None recommended by PT    Recommendations for Other Services       Precautions / Restrictions Precautions Precautions: Fall Restrictions Weight Bearing  Restrictions: No     Mobility  Bed Mobility Overal bed mobility: Needs Assistance Bed Mobility: Supine to Sit     Supine to sit: HOB elevated, Min assist     General bed mobility comments: OOB in recliner    Transfers Overall transfer level: Needs assistance Equipment used: Rolling walker (2 wheels) Transfers: Sit to/from Stand Sit to Stand: Supervision           General transfer comment: pt demonstrated good recall for safe technique with sit<>stand from recliner. no cues or assist.    Ambulation/Gait Ambulation/Gait assistance: Supervision Gait Distance (Feet): 180 Feet Assistive device: Rolling walker (2 wheels) Gait Pattern/deviations: Step-through pattern, Decreased stride length, Decreased weight shift to right, Decreased stance time - right Gait velocity: fair     General Gait Details: pt maintained safe RW management, no overt LOB and steady pace.   Stairs Stairs: Yes Stairs assistance: Supervision Stair Management: Two rails, Step to pattern, Forwards Number of Stairs: 3 General stair comments: cues for step pattern "up with good, down with bad" no overt LOB noted.   Wheelchair Mobility    Modified Rankin (Stroke Patients Only)       Balance Overall balance assessment: Needs assistance Sitting-balance support: Feet supported Sitting balance-Leahy Scale: Good     Standing balance support: Reliant on assistive device for balance, During functional activity, Bilateral upper extremity supported Standing balance-Leahy Scale: Poor                              Cognition Arousal/Alertness: Awake/alert Behavior During Therapy: WFL for tasks assessed/performed Overall Cognitive Status: Within Functional Limits for tasks assessed  Exercises Total Joint Exercises Ankle Circles/Pumps: AROM, Both, 15 reps Quad Sets: AROM, Right, 5 reps Short Arc Quad: AROM, Right, 5 reps Heel  Slides: Right, 5 reps, AROM Hip ABduction/ADduction: AAROM, Right, 5 reps Long Arc Quad: AROM, Right, 10 reps    General Comments        Pertinent Vitals/Pain Pain Assessment Pain Assessment: 0-10 Pain Score: 5  Faces Pain Scale: Hurts even more Pain Location: Rt hip Pain Descriptors / Indicators: Burning, Discomfort Pain Intervention(s): Limited activity within patient's tolerance, Monitored during session, Repositioned    Home Living Family/patient expects to be discharged to:: Private residence Living Arrangements: Alone;Children Available Help at Discharge: Enos Fling (friend) Type of Home: House Home Access: Stairs to enter Entrance Stairs-Rails: Right Entrance Stairs-Number of Steps: 3   Home Layout: One level Home Equipment: Agricultural consultant (2 wheels);Cane - single point      Prior Function            PT Goals (current goals can now be found in the care plan section) Acute Rehab PT Goals Patient Stated Goal: get home PT Goal Formulation: With patient Time For Goal Achievement: 11/17/21 Potential to Achieve Goals: Good Progress towards PT goals: Progressing toward goals    Frequency    Min 5X/week      PT Plan Current plan remains appropriate    Co-evaluation              AM-PAC PT "6 Clicks" Mobility   Outcome Measure  Help needed turning from your back to your side while in a flat bed without using bedrails?: A Little Help needed moving from lying on your back to sitting on the side of a flat bed without using bedrails?: A Little Help needed moving to and from a bed to a chair (including a wheelchair)?: A Little Help needed standing up from a chair using your arms (e.g., wheelchair or bedside chair)?: A Little Help needed to walk in hospital room?: A Little Help needed climbing 3-5 steps with a railing? : A Little 6 Click Score: 18    End of Session Equipment Utilized During Treatment: Gait belt Activity Tolerance: Patient tolerated  treatment well Patient left: in chair;with call bell/phone within reach;with chair alarm set;with family/visitor present Nurse Communication: Mobility status PT Visit Diagnosis: Muscle weakness (generalized) (M62.81);Difficulty in walking, not elsewhere classified (R26.2);Other abnormalities of gait and mobility (R26.89)     Time: 7672-0947 PT Time Calculation (min) (ACUTE ONLY): 20 min  Charges:  $Gait Training: 8-22 mins                     Wynn Maudlin, DPT Acute Rehabilitation Services Office [phone] Pager [phone]  11/10/21 2:30 PM

## 2021-11-10 NOTE — Progress Notes (Signed)
PROGRESS NOTE    [name]  HKV:[id number] DOB: 1940/07/14 DOA: 11/08/2021 PCP: Galvin Proffer, MD     Brief Narrative:  Natasha Chan  81 year old WF PMHx DM type II controlled without complication, HLD, essential HTN, Thyroid disease, Fibromyalgia, Gout   Here with mechanical fall just prior to arrival.  Natasha Chan onto her RIGHT hip.  Did not strike her head and no loss of consciousness.  No chest or abdominal discomfort.  Complains of sharp pain to her right hip that is worse with movement.  She cannot ambulate.  Denies any distal numbness or tingling to her right foot.    Subjective: 7/8 afebrile overnight. Afebrile overnight    Assessment & Plan: Covid vaccination;   Principal Problem:   Displaced fracture of right femoral neck (HCC) Active Problems:   Diabetes mellitus without complication (HCC)   Hypertension   Thyroid disease   Fibromyalgia   Gout   C. difficile colitis  Displaced fracture RIGHT femoral neck - Per Dr. Gean Birchwood [facility] reserved OR time for tomorrow at 1:00 for anterior right hip replacement -7/7 currently remains in OR   DM type II without complications -Moderate SSI  Essential HTN - Hold home medication.  Stable  Thyroid disease - Home medication  Fibromyalgia - Home medication  Gout - Home medication  C.Diff Colitis -Complete course of vancomycin  Obesity (BMI 30.63 kg/m.)    GERD - 7/7 repeat GI cocktail     Mobility Assessment (last 72 hours)     Mobility Assessment     Row Name 11/10/21 1414 11/10/21 1119 11/10/21 0900 11/09/21 1945 11/09/21 1725   Does patient have an order for bedrest or is patient medically unstable -- -- No - Continue assessment Yes- Bedfast (Level 1) - Complete Yes- Bedfast (Level 1) - Complete   What is the highest level of mobility based on the progressive mobility assessment? Level 5 (Walks with assist in room/hall) - Balance while stepping forward/back and can walk in room  with assist - Complete Level 5 (Walks with assist in room/hall) - Balance while stepping forward/back and can walk in room with assist - Complete Level 5 (Walks with assist in room/hall) - Balance while stepping forward/back and can walk in room with assist - Complete -- --              Interdisciplinary Goals of Care Family Meeting   Date carried out: 11/10/2021  Location of the meeting:   Member's involved:   Durable Power of Insurance risk surveyor:     Discussion: We discussed goals of care for Natasha Chan's .    Code status:   Disposition:   Time spent for the meeting:     Kameisha Malicki J, MD  11/10/2021, 6:42 PM         DVT prophylaxis:  Code Status: Full Family Communication:  Status is: Inpatient    Dispo: The patient is from: Home              Anticipated d/c is to: SNF              Anticipated d/c date is: 3 days              Patient currently is not medically stable to d/c.      Consultants:  Orthopedic surgery  Procedures/Significant Events:    I have personally reviewed and interpreted all radiology studies and my findings are as above.  VENTILATOR  SETTINGS:    Cultures   Antimicrobials: Anti-infectives (From admission, onward)    Start     Ordered Stop   11/09/21 1800  vancomycin (VANCOCIN) capsule 125 mg        11/09/21 1723     11/09/21 1245  ceFAZolin (ANCEF) IVPB 2g/100 mL premix        11/09/21 0752 11/09/21 1321         Devices    LINES / TUBES:      Continuous Infusions:  methocarbamol (ROBAXIN) IV       Objective: Vitals:   11/10/21 0452 11/10/21 0832 11/10/21 1015 11/10/21 1232  BP: 121/64  127/74 131/67  Pulse: (!) 102  (!) 108 (!) 107  Resp: 16  20 20   Temp: 98 F (36.7 C)  98.3 F (36.8 C) 98.3 F (36.8 C)  TempSrc: Oral  Oral Oral  SpO2: 96% 95% (!) 88% 96%  Weight:      Height:        Intake/Output Summary (Last 24 hours) at 11/10/2021 1842 Last data filed at  11/10/2021 1836 Gross per 24 hour  Intake 2514.65 ml  Output 2300 ml  Net 214.65 ml   Filed Weights   11/08/21 1326 11/09/21 1256  Weight: 83.5 kg 83.5 kg    Physical Exam:  General: A/O x4, No acute respiratory distress Eyes: negative scleral hemorrhage, negative anisocoria, negative icterus ENT: Negative Runny nose, negative gingival bleeding, Neck:  Negative scars, masses, torticollis, lymphadenopathy, JVD Lungs: Clear to auscultation bilaterally without wheezes or crackles Cardiovascular: Regular rate and rhythm without murmur gallop or rub normal S1 and S2 Abdomen: negative abdominal pain, nondistended, positive soft, bowel sounds, no rebound, no ascites, no appreciable mass Extremities: No significant cyanosis, clubbing, or edema bilateral lower extremities Skin: Negative rashes, lesions, ulcers Psychiatric:  Negative depression, negative anxiety, negative fatigue, negative mania  Central nervous system:  Cranial nerves II through XII intact, tongue/uvula midline, all extremities muscle strength 5/5, sensation intact throughout, negative dysarthria, negative expressive aphasia, negative receptive aphasia.     Data Reviewed: Care during the described time interval was provided by me .  I have reviewed this patient's available data, including medical history, events of note, physical examination, and all test results as part of my evaluation.  CBC: Recent Labs  Lab 11/08/21 1339 11/08/21 1705 11/09/21 0500 11/10/21 0934  WBC 15.2* 23.6* 15.7* 21.2*  NEUTROABS 12.8*  --  13.1* 19.1*  HGB 11.7* 11.6* 11.3* 9.8*  HCT 36.1 36.1 35.9* 30.9*  MCV 86.8 87.2 89.3 88.5  PLT 774* 801* 757* 620*   Basic Metabolic Panel: Recent Labs  Lab 11/08/21 1339 11/08/21 1705 11/09/21 0500 11/10/21 0934  NA 137  --  136 134*  K 4.5  --  4.4 5.1  CL 103  --  101 100  CO2 24  --  27 25  GLUCOSE 136*  --  134* 156*  BUN 22  --  22 22  CREATININE 1.68* 1.49* 1.44* 1.53*  CALCIUM 9.5   --  9.2 8.8*  MG  --   --  2.2 2.3  PHOS  --   --  5.1* 2.4*   GFR: Estimated Creatinine Clearance: 32.3 mL/min (A) (by C-G formula based on SCr of 1.53 mg/dL (H)). Liver Function Tests: Recent Labs  Lab 11/09/21 0500 11/10/21 0934  AST 19 20  ALT 20 13  ALKPHOS 104 86  BILITOT 0.5 0.6  PROT 6.9 6.6  ALBUMIN 2.9* 2.6*   No  results for input(s): "LIPASE", "AMYLASE" in the last 168 hours. No results for input(s): "AMMONIA" in the last 168 hours. Coagulation Profile: No results for input(s): "INR", "PROTIME" in the last 168 hours. Cardiac Enzymes: No results for input(s): "CKTOTAL", "CKMB", "CKMBINDEX", "TROPONINI" in the last 168 hours. BNP (last 3 results) No results for input(s): "PROBNP" in the last 8760 hours. HbA1C: Recent Labs    11/08/21 1339  HGBA1C 6.4*   CBG: Recent Labs  Lab 11/10/21 0011 11/10/21 0434 11/10/21 0730 11/10/21 1231 11/10/21 1646  GLUCAP 289* 205* 152* 163* 207*   Lipid Profile: Recent Labs    11/08/21 1339  CHOL 133  HDL 28*  LDLCALC 78  TRIG 536  CHOLHDL 4.8   Thyroid Function Tests: No results for input(s): "TSH", "T4TOTAL", "FREET4", "T3FREE", "THYROIDAB" in the last 72 hours. Anemia Panel: No results for input(s): "VITAMINB12", "FOLATE", "FERRITIN", "TIBC", "IRON", "RETICCTPCT" in the last 72 hours. Sepsis Labs: No results for input(s): "PROCALCITON", "LATICACIDVEN" in the last 168 hours.  Recent Results (from the past 240 hour(s))  SARS Coronavirus 2 by RT PCR (hospital order, performed in [facility] hospital lab) *cepheid single result test* Anterior Nasal Swab     Status: None   Collection Time: 11/08/21  4:11 PM   Specimen: Anterior Nasal Swab  Result Value Ref Range Status   SARS Coronavirus 2 by RT PCR NEGATIVE NEGATIVE Final    Comment: (NOTE) SARS-CoV-2 target nucleic acids are NOT DETECTED.  The SARS-CoV-2 RNA is generally detectable in upper and lower respiratory specimens during the acute phase of infection.  The lowest concentration of SARS-CoV-2 viral copies this assay can detect is 250 copies / mL. A negative result does not preclude SARS-CoV-2 infection and should not be used as the sole basis for treatment or other patient management decisions.  A negative result may occur with improper specimen collection / handling, submission of specimen other than nasopharyngeal swab, presence of viral mutation(s) within the areas targeted by this assay, and inadequate number of viral copies (<250 copies / mL). A negative result must be combined with clinical observations, patient history, and epidemiological information.  Fact Sheet for Patients:   RoadLapTop.co.za  Fact Sheet for Healthcare Providers: http://kim-miller.com/  This test is not yet approved or  cleared by the [place] FDA and has been authorized for detection and/or diagnosis of SARS-CoV-2 by FDA under an Emergency Use Authorization (EUA).  This EUA will remain in effect (meaning this test can be used) for the duration of the COVID-19 declaration under Section 564(b)(1) of the Act, 21 U.S.C. section 360bbb-3(b)(1), unless the authorization is terminated or revoked sooner.  Performed at [facility], [address], [place], [place] [address]          Radiology Studies: DG HIP UNILAT WITH PELVIS 1V RIGHT  Result Date: 11/09/2021 CLINICAL DATA:  Right hip arthroplasty EXAM: DG HIP (WITH OR WITHOUT PELVIS) 1V RIGHT; DG C-ARM 1-60 MIN-NO REPORT COMPARISON:  None Available. FLUOROSCOPY: Air kerma 2.00 mGy FINDINGS: Intraoperative fluoroscopic images of the right hip demonstrate total arthroplasty. No obvious perihardware fracture or component malpositioning. IMPRESSION: Intraoperative fluoroscopic images of the right hip demonstrate total arthroplasty. No obvious perihardware fracture or component malpositioning. Electronically Signed   By: Jearld Lesch M.D.   On:  11/09/2021 14:44   DG C-Arm 1-60 Min-No Report  Result Date: 11/09/2021 Fluoroscopy was utilized by the requesting physician.  No radiographic interpretation.   DG C-Arm 1-60 Min-No Report  Result Date: 11/09/2021  Fluoroscopy was utilized by the requesting physician.  No radiographic interpretation.        Scheduled Meds:  allopurinol  100 mg Oral Daily   irbesartan  150 mg Oral Daily   And   amLODipine  5 mg Oral Daily   And   hydrochlorothiazide  12.5 mg Oral Daily   aspirin  81 mg Oral BID   busPIRone  10 mg Oral BID   Chlorhexidine Gluconate Cloth  6 each Topical Daily   docusate sodium  100 mg Oral BID   DULoxetine  120 mg Oral Daily   hyoscyamine  0.25 mg Sublingual Once   insulin aspart  0-15 Units Subcutaneous Q4H   linagliptin  5 mg Oral Daily   mometasone-formoterol  2 puff Inhalation BID   pantoprazole  40 mg Oral Daily   pravastatin  20 mg Oral Daily   QUEtiapine  100 mg Oral QHS   thyroid  30 mg Oral Daily   vancomycin  125 mg Oral QID   Continuous Infusions:  methocarbamol (ROBAXIN) IV       LOS: 2 days    Time spent:40 min    Jmya Uliano, Roselind Messier, MD [facility]   If 7PM-7AM, please contact night-coverage 11/10/2021, 6:42 PM

## 2021-11-10 NOTE — Evaluation (Signed)
Physical Therapy Evaluation Patient Details Name: Natasha Chan MRN: [id number] DOB: 06/13/1940 Today's Date: 11/10/2021  History of Present Illness  Patient is 81 y.o. female s/p Rt THA on 11/09/21 due to Rt femoral neck fracture 2/2 fall. Patient was at home taking care of her grandchild around 1030 this morning was lifting the child into a crib slipped and fell onto her right side sustaining a displaced right femoral neck fracture. PMH significant for OA, DM, HTN, fibromyalgia, PE, Rt TKA, colon resection.    Clinical Impression  Natasha Chan is a 81 y.o. female POD 1 s/p Rt THA. Patient reports independence with mobility at baseline. Patient is now limited by functional impairments (see PT problem list below) and requires min guard/assist for transfers and gait with RW. Patient was able to ambulate ~110 feet with RW and min guard/assist. Patient instructed in exercise to facilitate circulation. Patient will benefit from continued skilled PT interventions to address impairments and progress towards PLOF. Acute PT will follow to progress mobility and stair training in preparation for safe discharge home.        Recommendations for follow up therapy are one component of a multi-disciplinary discharge planning process, led by the attending physician.  Recommendations may be updated based on patient status, additional functional criteria and insurance authorization.  Follow Up Recommendations Follow physician's recommendations for discharge plan and follow up therapies      Assistance Recommended at Discharge Intermittent Supervision/Assistance  Patient can return home with the following  A little help with walking and/or transfers;A little help with bathing/dressing/bathroom;Assistance with cooking/housework;Direct supervision/assist for medications management;Direct supervision/assist for financial management;Assist for transportation;Help with stairs or ramp for entrance    Equipment  Recommendations None recommended by PT  Recommendations for Other Services       Functional Status Assessment Patient has had a recent decline in their functional status and demonstrates the ability to make significant improvements in function in a reasonable and predictable amount of time.     Precautions / Restrictions Precautions Precautions: Fall Restrictions Weight Bearing Restrictions: No      Mobility  Bed Mobility Overal bed mobility: Needs Assistance Bed Mobility: Supine to Sit     Supine to sit: HOB elevated, Min assist     General bed mobility comments: cues for use of belt to assist Rt LE off EOB. min assist to fully raise trunk upright.    Transfers Overall transfer level: Needs assistance Equipment used: Rolling walker (2 wheels) Transfers: Sit to/from Stand Sit to Stand: Min assist           General transfer comment: cues for hand placement, pt power up with single UE on RW and min assist to rise.    Ambulation/Gait Ambulation/Gait assistance: Min guard Gait Distance (Feet): 110 Feet Assistive device: Rolling walker (2 wheels) Gait Pattern/deviations: Step-through pattern, Decreased stride length, Decreased weight shift to right, Decreased stance time - right Gait velocity: decr     General Gait Details: cues for step pattern, pt takign short step through, no LOB noted. cues to maintain safe proximity to RW at start and pt maintained throughout.  Stairs            Wheelchair Mobility    Modified Rankin (Stroke Patients Only)       Balance Overall balance assessment: Needs assistance Sitting-balance support: Feet supported Sitting balance-Leahy Scale: Good     Standing balance support: Reliant on assistive device for balance, During functional activity, Bilateral upper extremity supported Standing  balance-Leahy Scale: Poor                               Pertinent Vitals/Pain Pain Assessment Pain Assessment:  Faces Faces Pain Scale: Hurts even more Pain Location: Rt hip Pain Descriptors / Indicators: Burning, Discomfort Pain Intervention(s): Limited activity within patient's tolerance, Monitored during session, Repositioned, Patient requesting pain meds-RN notified, Ice applied    Home Living Family/patient expects to be discharged to:: Private residence Living Arrangements: Alone;Children Available Help at Discharge: Enos Fling (friend) Type of Home: House Home Access: Stairs to enter Entrance Stairs-Rails: Right Entrance Stairs-Number of Steps: 3   Home Layout: One level Home Equipment: Agricultural consultant (2 wheels);Cane - single point      Prior Function Prior Level of Function : Independent/Modified Independent                     Hand Dominance   Dominant Hand: Right    Extremity/Trunk Assessment   Upper Extremity Assessment Upper Extremity Assessment: Overall WFL for tasks assessed    Lower Extremity Assessment Lower Extremity Assessment: Overall WFL for tasks assessed    Cervical / Trunk Assessment Cervical / Trunk Assessment: Normal  Communication   Communication: No difficulties  Cognition Arousal/Alertness: Awake/alert Behavior During Therapy: WFL for tasks assessed/performed Overall Cognitive Status: Within Functional Limits for tasks assessed                                          General Comments      Exercises     Assessment/Plan    PT Assessment Patient needs continued PT services  PT Problem List Decreased strength;Decreased range of motion;Decreased mobility;Decreased activity tolerance;Decreased balance;Decreased knowledge of use of DME;Decreased safety awareness;Decreased knowledge of precautions;Pain       PT Treatment Interventions DME instruction;Gait training;Stair training;Functional mobility training;Therapeutic activities;Therapeutic exercise;Balance training;Patient/family education    PT Goals (Current goals can  be found in the Care Plan section)  Acute Rehab PT Goals Patient Stated Goal: get home PT Goal Formulation: With patient Time For Goal Achievement: 11/17/21 Potential to Achieve Goals: Good    Frequency Min 5X/week     Co-evaluation               AM-PAC PT "6 Clicks" Mobility  Outcome Measure Help needed turning from your back to your side while in a flat bed without using bedrails?: A Little Help needed moving from lying on your back to sitting on the side of a flat bed without using bedrails?: A Little Help needed moving to and from a bed to a chair (including a wheelchair)?: A Little Help needed standing up from a chair using your arms (e.g., wheelchair or bedside chair)?: A Little Help needed to walk in hospital room?: A Little Help needed climbing 3-5 steps with a railing? : A Little 6 Click Score: 18    End of Session Equipment Utilized During Treatment: Gait belt Activity Tolerance: Patient tolerated treatment well Patient left: in chair;with call bell/phone within reach;with chair alarm set;with family/visitor present Nurse Communication: Mobility status PT Visit Diagnosis: Muscle weakness (generalized) (M62.81);Difficulty in walking, not elsewhere classified (R26.2);Other abnormalities of gait and mobility (R26.89)    Time: 7902-4097 PT Time Calculation (min) (ACUTE ONLY): 16 min   Charges:   PT Evaluation $PT Eval Low Complexity: 1 Low  Wynn Maudlin, DPT Acute Rehabilitation Services Office [phone] Pager [phone]  11/10/21 1:08 PM

## 2021-11-10 NOTE — Discharge Summary (Incomplete)
Physician Discharge Summary  Natasha Chan DGU:[id number] DOB: [birth year] DOA: 11/08/2021  PCP: Galvin Proffer, MD  Admit date: 11/08/2021 Discharge date: 11/11/2021  Time spent: 35 minutes  Recommendations for Outpatient Follow-up:   Displaced fracture RIGHT femoral neck - Per Dr. Gean Birchwood [facility] reserved OR time for tomorrow at 1:00 for anterior right hip replacement -7/7 currently remains in OR -7/8 s/p Anterior R total hip arthroplasty using a 52 mm DePuy Pinnacle  Cup, [employer], 0-degree polyethylene liner, a +1.5 mm x 89mm metal head, a 3 hi Depuy Actis stem -Follow-up with Dr. Gean Birchwood orthopedic surgery in 2 weeks - Follow-up with home health services who will contact patient on Monday to schedule appointment   DM type II without complications - Restart home medication  Essential HTN - Restart home medication  Thyroid disease - Home medication  Fibromyalgia - Thyroid 30 mg daily  Gout - Home medication  Obesity (BMI 30.63 kg/m.)   GERD - 7/7 repeat GI cocktail   Leukocytosis - Most likely reactive, however given that patient has had TKA, and now RIGHT THA small chance infected.   -Trending down stable for discharge   Discharge Diagnoses:  Principal Problem:   Displaced fracture of right femoral neck (HCC) Active Problems:   Diabetes mellitus without complication (HCC)   Hypertension   Thyroid disease   Fibromyalgia   Gout   C. difficile colitis   Discharge Condition: Stable  Diet recommendation: Heart healthy/carb modified  Filed Weights   11/08/21 1326 11/09/21 1256  Weight: 83.5 kg 83.5 kg    History of present illness:  Natasha Chan  81 year old WF PMHx DM type II controlled without complication, HLD, essential HTN, Thyroid disease, Fibromyalgia, Gout   Here with mechanical fall just prior to arrival.  [place] onto her RIGHT hip.  Did not strike her head and no loss of consciousness.  No chest or abdominal  discomfort.  Complains of sharp pain to her right hip that is worse with movement.  She cannot ambulate.  Denies any distal numbness or tingling to her right foot.   Hospital Course:  See above  Procedures: 7/8 s/p Anterior R total hip arthroplasty using a 52 mm DePuy Pinnacle  Cup, [employer], 0-degree polyethylene liner, a +1.5 mm x 69mm metal head, a 3 hi Depuy Actis stem   Consultations: Orthopedic surgery    Discharge Exam: Vitals:   11/10/21 2008 11/11/21 0356 11/11/21 0818 11/11/21 1128  BP: 131/81 116/85  108/66  Pulse: 100 97  (!) 102  Resp: 18 18  16   Temp: 98 F (36.7 C) 97.9 F (36.6 C)  97.9 F (36.6 C)  TempSrc: Oral Oral  Oral  SpO2: 99% 94% 94% 95%  Weight:      Height:        General: A/O x4, No acute respiratory distress Eyes: negative scleral hemorrhage, negative anisocoria, negative icterus ENT: Negative Runny nose, negative gingival bleeding, Neck:  Negative scars, masses, torticollis, lymphadenopathy, JVD Lungs: Clear to auscultation bilaterally without wheezes or crackles Cardiovascular: Regular rate and rhythm without murmur gallop or rub normal S1 and S2  Discharge Instructions  Discharge Instructions     Weight bearing as tolerated   Complete by: As directed       Allergies as of 11/11/2021       Reactions   Ativan [lorazepam]    hyper   Codeine Hives, Itching, Nausea Only   Elemental Sulfur Hives   Sulfa  Antibiotics    Other reaction(s): Unknown        Medication List     STOP taking these medications    dicyclomine 20 MG tablet Commonly known as: BENTYL       TAKE these medications    allopurinol 100 MG tablet Commonly known as: ZYLOPRIM Take 100 mg by mouth daily.   aspirin EC 81 MG tablet Take 1 tablet (81 mg total) by mouth 2 (two) times daily.   budesonide-formoterol 160-4.5 MCG/ACT inhaler Commonly known as: SYMBICORT Inhale 2 puffs into the lungs daily as needed (wheezing & SOB).   busPIRone  10 MG tablet Commonly known as: BUSPAR Take 10 mg by mouth 2 (two) times daily.   cyanocobalamin 1000 MCG/ML injection Commonly known as: (VITAMIN B-12) Inject 1 mL into the muscle every 30 (thirty) days.   DULoxetine 60 MG capsule Commonly known as: CYMBALTA Take 120 mg by mouth daily.   fluticasone 50 MCG/ACT nasal spray Commonly known as: FLONASE Place 1 spray into both nostrils daily as needed.   furosemide 20 MG tablet Commonly known as: LASIX Take 20 mg by mouth daily as needed for fluid.   hyoscyamine 0.375 MG 12 hr tablet Commonly known as: LEVBID Take 0.375 mg by mouth 2 (two) times daily as needed for cramping.   liraglutide 18 MG/3ML Sopn Commonly known as: VICTOZA Inject 1.8 mg into the skin daily.   Olmesartan-amLODIPine-HCTZ 20-5-12.5 MG Tabs Take 1 tablet by mouth daily.   ondansetron 4 MG tablet Commonly known as: Zofran Take 1 tablet (4 mg total) by mouth every 8 (eight) hours as needed for nausea or vomiting.   oxyCODONE-acetaminophen 5-325 MG tablet Commonly known as: PERCOCET/ROXICET Take 1 tablet by mouth every 4 (four) hours as needed for severe pain.   potassium chloride 10 MEQ tablet Commonly known as: KLOR-CON M Take 20 mEq by mouth daily.   pravastatin 20 MG tablet Commonly known as: PRAVACHOL Take 20 mg by mouth daily.   QUEtiapine 100 MG tablet Commonly known as: SEROQUEL Take 100 mg by mouth at bedtime.   sitaGLIPtin 50 MG tablet Commonly known as: JANUVIA Take 50 mg by mouth daily.   thyroid 30 MG tablet Commonly known as: ARMOUR Take 30 mg by mouth daily.   tiZANidine 2 MG tablet Commonly known as: ZANAFLEX Take 1 tablet (2 mg total) by mouth every 6 (six) hours as needed.   vancomycin 125 MG capsule Commonly known as: VANCOCIN Take 125 mg by mouth every 6 (six) hours. For 10 days   Vitamin D3 1.25 MG (50000 UT) Tabs Take 50,000 Units by mouth once a week.               Durable Medical Equipment  (From  admission, onward)           Start     Ordered   11/09/21 1724  DME Walker rolling  Once       Question:  Patient needs a walker to treat with the following condition  Answer:  Status post right hip replacement   11/09/21 1723   11/09/21 1724  DME 3 n 1  Once        11/09/21 1723              Discharge Care Instructions  (From admission, onward)           Start     Ordered   11/11/21 0000  Weight bearing as tolerated  11/11/21 0721           Allergies  Allergen Reactions   Ativan [Lorazepam]     hyper   Codeine Hives, Itching and Nausea Only   Elemental Sulfur Hives   Sulfa Antibiotics     Other reaction(s): Unknown    Follow-up Information     Gean Birchwood, MD Follow up in 2 week(s).   Specialty: Orthopedic Surgery Contact information: [address] [place] [address] [phone]         [facility] Follow up.   Specialty: Home Health Services Why: they will call you to make time for the first appointment at the house.  Will be on Monday. Contact information: [address] [place] [address] [phone]                  The results of significant diagnostics from this hospitalization (including imaging, microbiology, ancillary and laboratory) are listed below for reference.    Significant Diagnostic Studies: DG HIP UNILAT WITH PELVIS 1V RIGHT  Result Date: 11/09/2021 CLINICAL DATA:  Right hip arthroplasty EXAM: DG HIP (WITH OR WITHOUT PELVIS) 1V RIGHT; DG C-ARM 1-60 MIN-NO REPORT COMPARISON:  None Available. FLUOROSCOPY: Air kerma 2.00 mGy FINDINGS: Intraoperative fluoroscopic images of the right hip demonstrate total arthroplasty. No obvious perihardware fracture or component malpositioning. IMPRESSION: Intraoperative fluoroscopic images of the right hip demonstrate total arthroplasty. No obvious perihardware fracture or component malpositioning. Electronically Signed   By: Jearld Lesch M.D.   On:  11/09/2021 14:44   DG C-Arm 1-60 Min-No Report  Result Date: 11/09/2021 Fluoroscopy was utilized by the requesting physician.  No radiographic interpretation.   DG C-Arm 1-60 Min-No Report  Result Date: 11/09/2021 Fluoroscopy was utilized by the requesting physician.  No radiographic interpretation.   DG Chest 1 View  Result Date: 11/08/2021 CLINICAL DATA:  Fall, right hip pain EXAM: CHEST  1 VIEW COMPARISON:  08/08/2016 FINDINGS: Low lung volumes are present, causing crowding of the pulmonary vasculature. Cardiac and mediastinal margins appear normal. Linear subsegmental atelectasis or scarring along the left hemidiaphragm. No blunting of the costophrenic angles. IMPRESSION: 1. Linear subsegmental atelectasis or scarring at the left lung base. 2. Low lung volumes are present, causing crowding of the pulmonary vasculature. Electronically Signed   By: Gaylyn Rong M.D.   On: 11/08/2021 14:32   DG Hip Unilat W or Wo Pelvis 2-3 Views Right  Result Date: 11/08/2021 CLINICAL DATA:  Larey Seat.  Right hip pain. EXAM: DG HIP (WITH OR WITHOUT PELVIS) 2-3V RIGHT COMPARISON:  None Available. FINDINGS: There is a displaced right femoral neck fracture. The left hip is intact. The pubic symphysis and SI joints are intact. No pelvic fractures. IMPRESSION: Displaced right femoral neck fracture. Electronically Signed   By: Rudie Meyer M.D.   On: 11/08/2021 14:26    Microbiology: Recent Results (from the past 240 hour(s))  SARS Coronavirus 2 by RT PCR (hospital order, performed in [facility] hospital lab) *cepheid single result test* Anterior Nasal Swab     Status: None   Collection Time: 11/08/21  4:11 PM   Specimen: Anterior Nasal Swab  Result Value Ref Range Status   SARS Coronavirus 2 by RT PCR NEGATIVE NEGATIVE Final    Comment: (NOTE) SARS-CoV-2 target nucleic acids are NOT DETECTED.  The SARS-CoV-2 RNA is generally detectable in upper and lower respiratory specimens during the acute phase of  infection. The lowest concentration of SARS-CoV-2 viral copies this assay can detect is  250 copies / mL. A negative result does not preclude SARS-CoV-2 infection and should not be used as the sole basis for treatment or other patient management decisions.  A negative result may occur with improper specimen collection / handling, submission of specimen other than nasopharyngeal swab, presence of viral mutation(s) within the areas targeted by this assay, and inadequate number of viral copies (<250 copies / mL). A negative result must be combined with clinical observations, patient history, and epidemiological information.  Fact Sheet for Patients:   RoadLapTop.co.za  Fact Sheet for Healthcare Providers: http://kim-miller.com/  This test is not yet approved or  cleared by the [place] FDA and has been authorized for detection and/or diagnosis of SARS-CoV-2 by FDA under an Emergency Use Authorization (EUA).  This EUA will remain in effect (meaning this test can be used) for the duration of the COVID-19 declaration under Section 564(b)(1) of the Act, 21 U.S.C. section 360bbb-3(b)(1), unless the authorization is terminated or revoked sooner.  Performed at [facility], [address] [place], [place] [address]      Labs: Basic Metabolic Panel: Recent Labs  Lab 11/08/21 1339 11/08/21 1705 11/09/21 0500 11/10/21 0934 11/11/21 0400  NA 137  --  136 134* 136  K 4.5  --  4.4 5.1 4.9  CL 103  --  101 100 103  CO2 24  --  27 25 26   GLUCOSE 136*  --  134* 156* 113*  BUN 22  --  22 22 26*  CREATININE 1.68* 1.49* 1.44* 1.53* 1.55*  CALCIUM 9.5  --  9.2 8.8* 9.1  MG  --   --  2.2 2.3 2.3  PHOS  --   --  5.1* 2.4* 3.2   Liver Function Tests: Recent Labs  Lab 11/09/21 0500 11/10/21 0934 11/11/21 0400  AST 19 20 16   ALT 20 13 12   ALKPHOS 104 86 76  BILITOT 0.5 0.6 0.5  PROT 6.9 6.6 6.2*  ALBUMIN 2.9* 2.6*  2.7*   No results for input(s): "LIPASE", "AMYLASE" in the last 168 hours. No results for input(s): "AMMONIA" in the last 168 hours. CBC: Recent Labs  Lab 11/08/21 1339 11/08/21 1705 11/09/21 0500 11/10/21 0934 11/11/21 0400  WBC 15.2* 23.6* 15.7* 21.2* 21.7*  NEUTROABS 12.8*  --  13.1* 19.1* 18.6*  HGB 11.7* 11.6* 11.3* 9.8* 9.8*  HCT 36.1 36.1 35.9* 30.9* 31.5*  MCV 86.8 87.2 89.3 88.5 89.0  PLT 774* 801* 757* 620* 634*   Cardiac Enzymes: No results for input(s): "CKTOTAL", "CKMB", "CKMBINDEX", "TROPONINI" in the last 168 hours. BNP: BNP (last 3 results) No results for input(s): "BNP" in the last 8760 hours.  ProBNP (last 3 results) No results for input(s): "PROBNP" in the last 8760 hours.  CBG: Recent Labs  Lab 11/10/21 2004 11/10/21 2358 11/11/21 0351 11/11/21 0733 11/11/21 1124  GLUCAP 162* 124* 118* 123* 123*       Signed:  01/12/22, MD [facility]

## 2021-11-10 NOTE — TOC Initial Note (Addendum)
Transition of Care [facility]) - Initial/Assessment Note    Patient Details  Name: ZHANA JEANGILLES MRN: [id number] Date of Birth: Nov 06, 1940  [facility] CM/SW Contact:    Golda Acre, RN Phone Number: 11/10/2021, 10:02 AM  Clinical Narrative:                  [facility]can do the pt for this patient.  Will start on Monday  Tct-patricia with adapt/will bring 3 in 1 and rolling walker to the room for dc to home.     Patient Goals and CMS Choice        Expected Discharge Plan and Services                                                Prior Living Arrangements/Services                       Activities of Daily Living      Permission Sought/Granted                  Emotional Assessment              Admission diagnosis:  Displaced fracture of right femoral neck (HCC) [S72.001A] Closed displaced fracture of right femoral neck (HCC) [S72.001A] Patient Active Problem List   Diagnosis Date Noted   Displaced fracture of right femoral neck (HCC) 11/08/2021   Diabetes mellitus without complication (HCC)    Hypertension    Thyroid disease    Fibromyalgia    Gout    Rectus diastasis 02/17/2012   PCP:  Galvin Proffer, MD Pharmacy:   [facility] [address] [place] - [address] [place] [address] Phone: [phone] Fax: [phone]  [facility] [phone] - [place] Lev[place], [place] - [address] AT [facility] [address] [facility] [address] [place] [address] Phone: [phone] Fax: [phone]     Social Determinants of Health (SDOH) Interventions    Readmission Risk Interventions     No data to display

## 2021-11-10 NOTE — Progress Notes (Signed)
Subjective: 1 Day Post-Op Procedure(s) (LRB): TOTAL HIP ARTROPLASTY ANTERIOR APPROACH (Right) Patient reports pain as mild.  Taking fluids by mouth.  Foley catheter is still in place.  Evaluated by PT yesterday.  Objective: Vital signs in last 24 hours: Temp:  [98 F (36.7 C)-98.4 F (36.9 C)] 98 F (36.7 C) (07/08 0452) Pulse Rate:  [94-111] 102 (07/08 0452) Resp:  [4-23] 16 (07/08 0452) BP: (97-144)/(43-87) 121/64 (07/08 0452) SpO2:  [84 %-98 %] 96 % (07/08 0452) Weight:  [83.5 kg] 83.5 kg (07/07 1256)  Intake/Output from previous day: 07/07 0701 - 07/08 0700 In: 4014.7 [I.V.:4014.7] Out: 2250 [Urine:2000; Blood:250] Intake/Output this shift: No intake/output data recorded.  Recent Labs    11/08/21 1339 11/08/21 1705 11/09/21 0500  HGB 11.7* 11.6* 11.3*   Recent Labs    11/08/21 1705 11/09/21 0500  WBC 23.6* 15.7*  RBC 4.14 4.02  HCT 36.1 35.9*  PLT 801* 757*   Recent Labs    11/08/21 1339 11/08/21 1705 11/09/21 0500  NA 137  --  136  K 4.5  --  4.4  CL 103  --  101  CO2 24  --  27  BUN 22  --  22  CREATININE 1.68* 1.49* 1.44*  GLUCOSE 136*  --  134*  CALCIUM 9.5  --  9.2   No results for input(s): "LABPT", "INR" in the last 72 hours. Right hip exam: Neurovascular intact Sensation intact distally Intact pulses distally Dorsiflexion/Plantar flexion intact Incision: dressing C/D/I No cellulitis present Compartment soft   Assessment/Plan: 1 Day Post-Op Procedure(s) (LRB): TOTAL HIP ARTROPLASTY ANTERIOR APPROACH (Right) Plan: Up with therapy weight-bear as tolerated on right lower extremity without hip precautions. Aspirin 81 mg twice daily x2 weeks postop then decrease to 1 daily. DC Foley this morning. If progresses with physical therapy today can be discharged home later this afternoon from orthopedic viewpoint. Postoperative prescriptions have been written and sent to her pharmacy.   Matthew Folks 11/10/2021, 8:22 AM

## 2021-11-11 DIAGNOSIS — S72001A Fracture of unspecified part of neck of right femur, initial encounter for closed fracture: Secondary | ICD-10-CM | POA: Diagnosis not present

## 2021-11-11 DIAGNOSIS — A0472 Enterocolitis due to Clostridium difficile, not specified as recurrent: Secondary | ICD-10-CM | POA: Diagnosis not present

## 2021-11-11 DIAGNOSIS — E119 Type 2 diabetes mellitus without complications: Secondary | ICD-10-CM | POA: Diagnosis not present

## 2021-11-11 DIAGNOSIS — M797 Fibromyalgia: Secondary | ICD-10-CM | POA: Diagnosis not present

## 2021-11-11 LAB — CBC WITH DIFFERENTIAL/PLATELET
Abs Immature Granulocytes: 0.49 10*3/uL — ABNORMAL HIGH (ref 0.00–0.07)
Basophils Absolute: 0 10*3/uL (ref 0.0–0.1)
Basophils Relative: 0 %
Eosinophils Absolute: 0 10*3/uL (ref 0.0–0.5)
Eosinophils Relative: 0 %
HCT: 31.5 % — ABNORMAL LOW (ref 36.0–46.0)
Hemoglobin: 9.8 g/dL — ABNORMAL LOW (ref 12.0–15.0)
Immature Granulocytes: 2 %
Lymphocytes Relative: 6 %
Lymphs Abs: 1.3 10*3/uL (ref 0.7–4.0)
MCH: 27.7 pg (ref 26.0–34.0)
MCHC: 31.1 g/dL (ref 30.0–36.0)
MCV: 89 fL (ref 80.0–100.0)
Monocytes Absolute: 1.4 10*3/uL — ABNORMAL HIGH (ref 0.1–1.0)
Monocytes Relative: 6 %
Neutro Abs: 18.6 10*3/uL — ABNORMAL HIGH (ref 1.7–7.7)
Neutrophils Relative %: 86 %
Platelets: 634 10*3/uL — ABNORMAL HIGH (ref 150–400)
RBC: 3.54 MIL/uL — ABNORMAL LOW (ref 3.87–5.11)
RDW: 15.9 % — ABNORMAL HIGH (ref 11.5–15.5)
WBC: 21.7 10*3/uL — ABNORMAL HIGH (ref 4.0–10.5)
nRBC: 0 % (ref 0.0–0.2)

## 2021-11-11 LAB — MAGNESIUM: Magnesium: 2.3 mg/dL (ref 1.7–2.4)

## 2021-11-11 LAB — COMPREHENSIVE METABOLIC PANEL
ALT: 12 U/L (ref 0–44)
AST: 16 U/L (ref 15–41)
Albumin: 2.7 g/dL — ABNORMAL LOW (ref 3.5–5.0)
Alkaline Phosphatase: 76 U/L (ref 38–126)
Anion gap: 7 (ref 5–15)
BUN: 26 mg/dL — ABNORMAL HIGH (ref 8–23)
CO2: 26 mmol/L (ref 22–32)
Calcium: 9.1 mg/dL (ref 8.9–10.3)
Chloride: 103 mmol/L (ref 98–111)
Creatinine, Ser: 1.55 mg/dL — ABNORMAL HIGH (ref 0.44–1.00)
GFR, Estimated: 34 mL/min — ABNORMAL LOW (ref >60)
Glucose, Bld: 113 mg/dL — ABNORMAL HIGH (ref 70–99)
Potassium: 4.9 mmol/L (ref 3.5–5.1)
Sodium: 136 mmol/L (ref 135–145)
Total Bilirubin: 0.5 mg/dL (ref 0.3–1.2)
Total Protein: 6.2 g/dL — ABNORMAL LOW (ref 6.5–8.1)

## 2021-11-11 LAB — GLUCOSE, CAPILLARY
Glucose-Capillary: 118 mg/dL — ABNORMAL HIGH (ref 70–99)
Glucose-Capillary: 123 mg/dL — ABNORMAL HIGH (ref 70–99)
Glucose-Capillary: 123 mg/dL — ABNORMAL HIGH (ref 70–99)

## 2021-11-11 LAB — PHOSPHORUS: Phosphorus: 3.2 mg/dL (ref 2.5–4.6)

## 2021-11-11 MED ORDER — GLUCERNA SHAKE PO LIQD
237.0000 mL | Freq: Three times a day (TID) | ORAL | Status: DC
  Filled 2021-11-11 (×2): qty 237

## 2021-11-11 NOTE — Progress Notes (Signed)
PT Cancellation Note  Patient Details Name: Natasha Chan MRN: [id number] DOB: 1941-01-04   Cancelled Treatment:    Reason Eval/Treat Not Completed: Other (comment); pt declines PT at this time stating she wants to nap. Pt feels she is ready for d/c.    [facility] 11/11/2021, 10:23 AM

## 2021-11-11 NOTE — Progress Notes (Signed)
PATIENT ID: Natasha Chan  MRN: [id number]  DOB/AGE:  1940-12-22 / 81 y.o.  2 Days Post-Op Procedure(s) (LRB): TOTAL HIP ARTROPLASTY ANTERIOR APPROACH (Right)  Subjective: Patient reports that she does have soreness in the right hip but overall pain is mild. Denies shortness or breath and chest pain. Voiding well. Positive flatus. Reports that she has arranged for help at home for the next several days.   Objective: Vital signs in last 24 hours: Temp:  [97.9 F (36.6 C)-98.3 F (36.8 C)] 97.9 F (36.6 C) (07/09 0356) Pulse Rate:  [97-108] 97 (07/09 0356) Resp:  [18-20] 18 (07/09 0356) BP: (116-131)/(67-85) 116/85 (07/09 0356) SpO2:  [88 %-99 %] 94 % (07/09 0356)  Intake/Output from previous day: 07/08 0701 - 07/09 0700 In: 200 [P.O.:200] Out: 400 [Urine:400]   Recent Labs    11/08/21 1339 11/08/21 1705 11/09/21 0500 11/10/21 0934 11/11/21 0400  HGB 11.7* 11.6* 11.3* 9.8* 9.8*   Recent Labs    11/10/21 0934 11/11/21 0400  WBC 21.2* 21.7*  RBC 3.49* 3.54*  HCT 30.9* 31.5*  PLT 620* 634*   Recent Labs    11/10/21 0934 11/11/21 0400  NA 134* 136  K 5.1 4.9  CL 100 103  CO2 25 26  BUN 22 26*  CREATININE 1.53* 1.55*  GLUCOSE 156* 113*  CALCIUM 8.8* 9.1     Physical Exam: Neurologically intact Sensation intact distally Intact pulses distally Dorsiflexion/Plantar flexion intact Incision: dressing C/D/I No cellulitis present Compartment soft  Assessment/Plan: 2 Days Post-Op Procedure(s) (LRB): TOTAL HIP ARTROPLASTY ANTERIOR APPROACH (Right)   Advance diet Up with therapy Discharge home with home health when medically stable Weight Bearing as Tolerated (WBAT) RLE Aspirin 81 mg twice daily x2 weeks postop then decrease to 1 daily  Continue PT. Possible DC home today if she does well with PT and is medically stable. Follow up with Dr Turner Daniels in 2 weeks. HHPT arranged.    Tomoki Lucken L. [place], PA-C 11/11/2021, 7:21 AM

## 2021-11-13 ENCOUNTER — Encounter (HOSPITAL_COMMUNITY): Payer: Self-pay | Admitting: Orthopedic Surgery

## 2022-10-15 ENCOUNTER — Other Ambulatory Visit: Payer: Self-pay | Admitting: Nephrology

## 2022-10-15 DIAGNOSIS — N1832 Chronic kidney disease, stage 3b: Secondary | ICD-10-CM

## 2022-10-18 ENCOUNTER — Ambulatory Visit
Admission: RE | Admit: 2022-10-18 | Discharge: 2022-10-18 | Disposition: A | Discharge status: Home / Self Care | Payer: Medicare Other | Source: Ambulatory Visit | Attending: Nephrology | Admitting: Nephrology

## 2022-10-18 DIAGNOSIS — N1832 Chronic kidney disease, stage 3b: Secondary | ICD-10-CM

## 2023-02-26 ENCOUNTER — Other Ambulatory Visit: Payer: Self-pay | Admitting: Obstetrics & Gynecology

## 2023-02-26 DIAGNOSIS — R928 Other abnormal and inconclusive findings on diagnostic imaging of breast: Secondary | ICD-10-CM

## 2023-03-17 ENCOUNTER — Ambulatory Visit
Admission: RE | Admit: 2023-03-17 | Discharge: 2023-03-17 | Disposition: A | Discharge status: Home / Self Care | Payer: Medicare Other | Source: Ambulatory Visit | Attending: Obstetrics & Gynecology | Admitting: Obstetrics & Gynecology

## 2023-03-17 ENCOUNTER — Other Ambulatory Visit: Payer: Medicare Other

## 2023-03-17 DIAGNOSIS — R928 Other abnormal and inconclusive findings on diagnostic imaging of breast: Secondary | ICD-10-CM

## 2023-06-10 NOTE — Progress Notes (Signed)
 This encounter was created in error - please disregard.

## 2024-02-10 ENCOUNTER — Other Ambulatory Visit: Payer: Self-pay | Admitting: Nephrology

## 2024-02-10 DIAGNOSIS — N1832 Chronic kidney disease, stage 3b: Secondary | ICD-10-CM

## 2024-02-16 ENCOUNTER — Ambulatory Visit
Admission: RE | Admit: 2024-02-16 | Discharge: 2024-02-16 | Disposition: A | Discharge status: Home / Self Care | Source: Ambulatory Visit | Attending: Nephrology | Admitting: Nephrology

## 2024-02-16 DIAGNOSIS — N1832 Chronic kidney disease, stage 3b: Secondary | ICD-10-CM
